# Patient Record
Sex: Female | Born: 1980
Health system: Southern US, Community
[De-identification: ages and names within clinical notes are randomized; demographics above are authoritative.]

## PROBLEM LIST (undated history)

## (undated) DIAGNOSIS — E669 Obesity, unspecified: Secondary | ICD-10-CM

## (undated) DIAGNOSIS — L509 Urticaria, unspecified: Secondary | ICD-10-CM

## (undated) DIAGNOSIS — T783XXA Angioneurotic edema, initial encounter: Secondary | ICD-10-CM

## (undated) DIAGNOSIS — J309 Allergic rhinitis, unspecified: Secondary | ICD-10-CM

## (undated) DIAGNOSIS — G43829 Menstrual migraine, not intractable, without status migrainosus: Secondary | ICD-10-CM

## (undated) DIAGNOSIS — L409 Psoriasis, unspecified: Secondary | ICD-10-CM

## (undated) HISTORY — DX: Angioneurotic edema, initial encounter: T78.3XXA

## (undated) HISTORY — DX: Urticaria, unspecified: L50.9

## (undated) HISTORY — DX: Obesity, unspecified: E66.9

## (undated) HISTORY — DX: Psoriasis, unspecified: L40.9

## (undated) HISTORY — PX: CHOLECYSTECTOMY: SHX55

## (undated) HISTORY — DX: Menstrual migraine, not intractable, without status migrainosus: G43.829

## (undated) HISTORY — DX: Allergic rhinitis, unspecified: J30.9

---

## 2016-09-15 ENCOUNTER — Encounter: Payer: Self-pay | Admitting: Obstetrics and Gynecology

## 2016-09-15 ENCOUNTER — Ambulatory Visit (INDEPENDENT_AMBULATORY_CARE_PROVIDER_SITE_OTHER): Payer: 59 | Admitting: Obstetrics and Gynecology

## 2016-09-15 VITALS — BP 118/76 | Ht 67.0 in | Wt 222.0 lb

## 2016-09-15 DIAGNOSIS — Z3041 Encounter for surveillance of contraceptive pills: Secondary | ICD-10-CM | POA: Diagnosis not present

## 2016-09-15 DIAGNOSIS — Z01419 Encounter for gynecological examination (general) (routine) without abnormal findings: Secondary | ICD-10-CM | POA: Diagnosis not present

## 2016-09-15 MED ORDER — DROSPIRENONE-ETHINYL ESTRADIOL 3-0.03 MG PO TABS: 1.0000 | ORAL_TABLET | Freq: Every day | ORAL | 12 refills | Status: DC

## 2016-09-15 NOTE — Progress Notes (Signed)
HPI:      [name] is a 36 y.o. G0P0000 who LMP was 08/25/16, presents today for her annual examination.  Her menses are regular every 28-30 days, lasting 5 days, no BTB. Dysmenorrhea none.  She is single partner, contraception - OCP (estrogen/progesterone)  Last Pap: 08/21/14. Results were: neg/neg HPV Hx of STDs: none  There is no FH of breast or ovarian cancer. The patient does due self-breast exams.  no tobacco use very active   ROS: Constitutional: Denied constitutional symptoms, night sweats, recent illness, fatigue, fever, insomnia and weight loss.  Eyes: Denied eye symptoms, eye pain, photophobia, vision change and visual disturbance.  Ears/Nose/Throat/Neck: Denied ear, nose, throat or neck symptoms, hearing loss, nasal discharge, sinus congestion and sore throat.  Cardiovascular: Denied cardiovascular symptoms, arrhythmia, chest pain/pressure, edema, exercise intolerance, orthopnea and palpitations.  Respiratory: Denied pulmonary symptoms, asthma, pleuritic pain, productive sputum, cough, dyspnea and wheezing.  Gastrointestinal: Denied, gastro-esophageal reflux, melena, nausea and vomiting.  Genitourinary: Denied genitourinary symptoms including symptomatic vaginal discharge, pelvic relaxation issues, and urinary complaints.  Musculoskeletal: Denied musculoskeletal symptoms, stiffness, swelling, muscle weakness and myalgia.  Dermatologic: Denied dermatology symptoms, rash and scar.  Neurologic: Denied neurology symptoms, dizziness, headache, neck pain and syncope.  Psychiatric: Denied psychiatric symptoms, anxiety and depression.  Endocrine: Denied endocrine symptoms including hot flashes and night sweats.   Objective: Vitals:   09/15/16 0900  BP: 118/76    Physical Exam  Constitutional: She is oriented to person, place, and time. She appears well-developed and well-nourished.  Genitourinary: Vagina normal and uterus normal.  Neck: Normal range of motion. No  thyromegaly present.  Cardiovascular: Normal rate.   Pulmonary/Chest: Effort normal.  Abdominal: Soft.  Neurological: She is alert and oriented to person, place, and time.  Psychiatric: She has a normal mood and affect. Her behavior is normal.  Vitals reviewed.    Assessment: 1. Encounter for annual routine gynecological examination   2. Encounter for surveillance of contraceptive pills      Plan 1. Breast screening- Exam  2. OCP Rx 3. Counseling for contraception: oral contraceptives (estrogen/progesterone)  GYN counsel breast self exam, diet and exercise    Orders Meds ordered this encounter  Medications  . drospirenone-ethinyl estradiol (YASMIN,ZARAH,SYEDA) 3-0.03 MG tablet    Sig: Take 1 tablet by mouth daily.    Dispense:  1 Package    Refill:  12           F/U  Return in 1 year (on 09/15/2017).  Armentha Branagan B. Macsen Nuttall, PA-C 09/15/2016 9:46 AM

## 2017-09-16 ENCOUNTER — Ambulatory Visit: Payer: 59 | Admitting: Obstetrics and Gynecology

## 2017-09-21 ENCOUNTER — Encounter: Payer: Self-pay | Admitting: Obstetrics and Gynecology

## 2017-09-21 ENCOUNTER — Ambulatory Visit (INDEPENDENT_AMBULATORY_CARE_PROVIDER_SITE_OTHER): Payer: 59 | Admitting: Obstetrics and Gynecology

## 2017-09-21 VITALS — BP 130/82 | HR 85 | Ht 67.0 in | Wt 227.0 lb

## 2017-09-21 DIAGNOSIS — Z1151 Encounter for screening for human papillomavirus (HPV): Secondary | ICD-10-CM

## 2017-09-21 DIAGNOSIS — Z124 Encounter for screening for malignant neoplasm of cervix: Secondary | ICD-10-CM

## 2017-09-21 DIAGNOSIS — Z3041 Encounter for surveillance of contraceptive pills: Secondary | ICD-10-CM | POA: Diagnosis not present

## 2017-09-21 DIAGNOSIS — Z01419 Encounter for gynecological examination (general) (routine) without abnormal findings: Secondary | ICD-10-CM | POA: Diagnosis not present

## 2017-09-21 MED ORDER — DROSPIRENONE-ETHINYL ESTRADIOL 3-0.03 MG PO TABS: 1.0000 | ORAL_TABLET | Freq: Every day | ORAL | 3 refills | Status: DC

## 2017-09-21 NOTE — Patient Instructions (Signed)
I value your feedback and entrusting us with your care. If you get a [facility] patient survey, I would appreciate you taking the time to let us know about your experience today. Thank you! 

## 2017-09-21 NOTE — Progress Notes (Signed)
PCP:  Patient, No Pcp Per   Chief Complaint  Patient presents with  . Gynecologic Exam     HPI:      [name] is a 37 y.o. G0P0000 who LMP was Patient's last menstrual period was 08/23/2017 (exact date)., presents today for her annual examination.  Her menses are regular every 28-30 days, lasting 5 days.  Dysmenorrhea none. She does not have intermenstrual bleeding.  Sex activity: single partner, contraception - OCP (estrogen/progesterone).  Last Pap: August 21, 2014  Results were: no abnormalities /neg HPV DNA  Hx of STDs: none  There is no FH of breast cancer. There is no FH of ovarian cancer. The patient does not do self-breast exams.  Tobacco use: The patient denies current or previous tobacco use. Alcohol use: none No drug use.  Exercise: very active  She does get adequate calcium but not Vitamin D in her diet.   Past Medical History:  Diagnosis Date  . Obesity     Past Surgical History:  Procedure Laterality Date  . CHOLECYSTECTOMY      Family History  Problem Relation Age of Onset  . Diabetes type II Father   . Basal cell carcinoma Maternal Grandmother   . Colon cancer Maternal Grandmother 80  . Congestive Heart Failure Maternal Grandmother   . Lung cancer Maternal Grandmother 80  . Alzheimer's disease Maternal Grandfather   . Brain cancer Maternal Grandfather [id number]       melanoma    Social History   Socioeconomic History  . Marital status: Single    Spouse name: Not on file  . Number of children: Not on file  . Years of education: Not on file  . Highest education level: Not on file  Social Needs  . Financial resource strain: Not on file  . Food insecurity - worry: Not on file  . Food insecurity - inability: Not on file  . Transportation needs - medical: Not on file  . Transportation needs - non-medical: Not on file  Occupational History  . Not on file  Tobacco Use  . Smoking status: Former Games developermoker  . Smokeless tobacco: Never Used    Substance and Sexual Activity  . Alcohol use: No  . Drug use: No  . Sexual activity: Yes    Birth control/protection: Pill  Other Topics Concern  . Not on file  Social History Narrative  . Not on file    Current Meds  Medication Sig  . drospirenone-ethinyl estradiol (YASMIN,ZARAH,SYEDA) 3-0.03 MG tablet Take 1 tablet by mouth daily.  . [DISCONTINUED] drospirenone-ethinyl estradiol (YASMIN,ZARAH,SYEDA) 3-0.03 MG tablet Take 1 tablet by mouth daily.     ROS:  Review of Systems  Constitutional: Negative for fatigue, fever and unexpected weight change.  Respiratory: Negative for cough, shortness of breath and wheezing.   Cardiovascular: Negative for chest pain, palpitations and leg swelling.  Gastrointestinal: Negative for blood in stool, constipation, diarrhea, nausea and vomiting.  Endocrine: Negative for cold intolerance, heat intolerance and polyuria.  Genitourinary: Negative for dyspareunia, dysuria, flank pain, frequency, genital sores, hematuria, menstrual problem, pelvic pain, urgency, vaginal bleeding, vaginal discharge and vaginal pain.  Musculoskeletal: Negative for back pain, joint swelling and myalgias.  Skin: Negative for rash.  Neurological: Negative for dizziness, syncope, light-headedness, numbness and headaches.  Hematological: Negative for adenopathy.  Psychiatric/Behavioral: Negative for agitation, confusion, sleep disturbance and suicidal ideas. The patient is not nervous/anxious.      Objective: BP 130/82   Pulse 85  Ht 5\' 7"  (1.702 m)   Wt 227 lb (103 kg)   LMP 08/23/2017 (Exact Date)   BMI 35.55 kg/m    Physical Exam  Constitutional: She is oriented to person, place, and time. She appears well-developed and well-nourished.  Genitourinary: Vagina normal and uterus normal. There is no rash or tenderness on the right labia. There is no rash or tenderness on the left labia. No erythema or tenderness in the vagina. No vaginal discharge found. Right  adnexum does not display mass and does not display tenderness. Left adnexum does not display mass and does not display tenderness. Cervix does not exhibit motion tenderness or polyp. Uterus is not enlarged or tender.  Neck: Normal range of motion. No thyromegaly present.  Cardiovascular: Normal rate, regular rhythm and normal heart sounds.  No murmur heard. Pulmonary/Chest: Effort normal and breath sounds normal. Right breast exhibits no mass, no nipple discharge, no skin change and no tenderness. Left breast exhibits no mass, no nipple discharge, no skin change and no tenderness.  Abdominal: Soft. There is no tenderness. There is no guarding.  Musculoskeletal: Normal range of motion.  Neurological: She is alert and oriented to person, place, and time. No cranial nerve deficit.  Psychiatric: She has a normal mood and affect. Her behavior is normal.  Vitals reviewed.   Assessment/Plan: Encounter for annual routine gynecological examination  Cervical cancer screening - Plan: IGP, Aptima HPV  Screening for HPV (human papillomavirus) - Plan: IGP, Aptima HPV  Encounter for surveillance of contraceptive pills - OCP RF - Plan: drospirenone-ethinyl estradiol (YASMIN,ZARAH,SYEDA) 3-0.03 MG tablet  Meds ordered this encounter  Medications  . drospirenone-ethinyl estradiol (YASMIN,ZARAH,SYEDA) 3-0.03 MG tablet    Sig: Take 1 tablet by mouth daily.    Dispense:  3 Package    Refill:  3    Order Specific Question:   Supervising Provider    Answer:   Nadara Mustard [[id number]]            GYN counsel adequate intake of calcium and vitamin D     F/U  Return in about 1 year (around 09/22/2018).  Jenna Rouser B. Michell Giuliano, PA-C 09/21/2017 8:37 AM

## 2017-09-24 LAB — IGP, APTIMA HPV
HPV Aptima: NEGATIVE
PAP SMEAR COMMENT: 0

## 2018-05-10 ENCOUNTER — Ambulatory Visit (INDEPENDENT_AMBULATORY_CARE_PROVIDER_SITE_OTHER): Payer: 59 | Admitting: Obstetrics and Gynecology

## 2018-05-10 ENCOUNTER — Encounter: Payer: Self-pay | Admitting: Obstetrics and Gynecology

## 2018-05-10 VITALS — BP 138/70 | HR 85 | Ht 67.0 in | Wt 217.0 lb

## 2018-05-10 DIAGNOSIS — G43839 Menstrual migraine, intractable, without status migrainosus: Secondary | ICD-10-CM | POA: Diagnosis not present

## 2018-05-10 DIAGNOSIS — Z3041 Encounter for surveillance of contraceptive pills: Secondary | ICD-10-CM | POA: Diagnosis not present

## 2018-05-10 MED ORDER — DROSPIRENONE-ETHINYL ESTRADIOL 3-0.03 MG PO TABS: 1.0000 | ORAL_TABLET | Freq: Every day | ORAL | 1 refills | Status: DC

## 2018-05-10 NOTE — Progress Notes (Signed)
Patient, No Pcp Per   Chief Complaint  Patient presents with  . Headache    last entire cycle (3-5 days) x 2 yrs    HPI:      Jenna Wright is a 37 y.o. G0P0000 who LMP was Patient's last menstrual period was 05/04/2018 (exact date)., presents today for menstrual headaches. Pt has monthly headaches and started keeping HA journal recently. She realized sx are with period onset and last until 1-2 days into active pills in new pill pack. Pain is dull and constant, relieved with tylenol arthritis XR. Pt denies n/v/photo or phonophobia. No vision changes. No headaches rest of the month. Pt has noted duration of headaches is longer more recently, and upon retrospection, notes she has had sx for about 2 yrs.  Pt is on yasmin. Menses and headaches start 4 days into placebo pills. Flow is for 2-3 days and light. Pt was on several OCPs with side effects until she found yasmin that works well for her.  Annual due 3/20.   Past Medical History:  Diagnosis Date  . Obesity     Past Surgical History:  Procedure Laterality Date  . CHOLECYSTECTOMY      Family History  Problem Relation Age of Onset  . Diabetes type II Father   . Basal cell carcinoma Maternal Grandmother   . Colon cancer Maternal Grandmother 80  . Congestive Heart Failure Maternal Grandmother   . Lung cancer Maternal Grandmother 80  . Alzheimer's disease Maternal Grandfather   . Brain cancer Maternal Grandfather 62       melanoma    Social History   Socioeconomic History  . Marital status: Single    Spouse name: Not on file  . Number of children: Not on file  . Years of education: Not on file  . Highest education level: Not on file  Occupational History  . Not on file  Social Needs  . Financial resource strain: Not on file  . Food insecurity:    Worry: Not on file    Inability: Not on file  . Transportation needs:    Medical: Not on file    Non-medical: Not on file  Tobacco Use  . Smoking status:  Former Games developer  . Smokeless tobacco: Never Used  Substance and Sexual Activity  . Alcohol use: No  . Drug use: No  . Sexual activity: Yes    Birth control/protection: Pill  Lifestyle  . Physical activity:    Days per week: Not on file    Minutes per session: Not on file  . Stress: Not on file  Relationships  . Social connections:    Talks on phone: Not on file    Gets together: Not on file    Attends religious service: Not on file    Active member of club or organization: Not on file    Attends meetings of clubs or organizations: Not on file    Relationship status: Not on file  . Intimate partner violence:    Fear of current or ex partner: Not on file    Emotionally abused: Not on file    Physically abused: Not on file    Forced sexual activity: Not on file  Other Topics Concern  . Not on file  Social History Narrative  . Not on file    Outpatient Medications Prior to Visit  Medication Sig Dispense Refill  . drospirenone-ethinyl estradiol (YASMIN,ZARAH,SYEDA) 3-0.03 MG tablet Take 1 tablet by mouth daily. 3  Package 3   No facility-administered medications prior to visit.       ROS:  Review of Systems  Constitutional: Negative for fever.  Gastrointestinal: Negative for blood in stool, constipation, diarrhea, nausea and vomiting.  Genitourinary: Negative for dyspareunia, dysuria, flank pain, frequency, hematuria, urgency, vaginal bleeding, vaginal discharge and vaginal pain.  Musculoskeletal: Negative for back pain.  Skin: Negative for rash.  Neurological: Positive for headaches.   BREAST: No symptoms   OBJECTIVE:   Vitals:  BP 138/70   Pulse 85   Ht 5\' 7"  (1.702 m)   Wt 217 lb (98.4 kg)   LMP 05/04/2018 (Exact Date)   BMI 33.99 kg/m   Physical Exam  Constitutional: She is oriented to person, place, and time. She appears well-developed.  Neck: Normal range of motion.  Pulmonary/Chest: Effort normal.  Musculoskeletal: Normal range of motion.    Neurological: She is alert and oriented to person, place, and time. No cranial nerve deficit.  Psychiatric: She has a normal mood and affect. Her behavior is normal. Judgment and thought content normal.  Vitals reviewed.   Assessment/Plan: Intractable menstrual migraine without status migrainosus - Discussed OCP change to Yaz (lower estrogen dose) vs cont dosing. Pt wants to try yasmin cont dosing first. Rx eRxd with instructions. F/u prn.   Encounter for surveillance of contraceptive pills - OCP RF - Plan: drospirenone-ethinyl estradiol (YASMIN,ZARAH,SYEDA) 3-0.03 MG tablet    Meds ordered this encounter  Medications  . drospirenone-ethinyl estradiol (YASMIN,ZARAH,SYEDA) 3-0.03 MG tablet    Sig: Take 1 tablet by mouth daily. CONTINUOUS DOSING FOR HEADACHES    Dispense:  3 Package    Refill:  1    Order Specific Question:   Supervising Provider    Answer:   Nadara Mustard [[id number]]      Return if symptoms worsen or fail to improve.  Alicia B. Copland, PA-C 05/10/2018 3:40 PM

## 2018-05-10 NOTE — Patient Instructions (Signed)
I value your feedback and entrusting us with your care. If you get a [facility] patient survey, I would appreciate you taking the time to let us know about your experience today. Thank you! 

## 2018-07-05 ENCOUNTER — Other Ambulatory Visit: Payer: Self-pay

## 2018-07-05 DIAGNOSIS — Z3041 Encounter for surveillance of contraceptive pills: Secondary | ICD-10-CM

## 2018-07-05 MED ORDER — DROSPIRENONE-ETHINYL ESTRADIOL 3-0.03 MG PO TABS: 1.0000 | ORAL_TABLET | Freq: Every day | ORAL | 1 refills | Status: DC

## 2018-07-05 NOTE — Telephone Encounter (Signed)
Pt [facility]  [phone]  Left detailed msg pharm has been changed and rx sent.

## 2018-08-02 ENCOUNTER — Ambulatory Visit (INDEPENDENT_AMBULATORY_CARE_PROVIDER_SITE_OTHER): Payer: No Typology Code available for payment source | Admitting: Family Medicine

## 2018-08-02 ENCOUNTER — Encounter: Payer: Self-pay | Admitting: Family Medicine

## 2018-08-02 VITALS — BP 126/80 | HR 80 | Temp 98.2°F | Ht 67.0 in | Wt 221.6 lb

## 2018-08-02 DIAGNOSIS — Z203 Contact with and (suspected) exposure to rabies: Secondary | ICD-10-CM

## 2018-08-02 DIAGNOSIS — Z114 Encounter for screening for human immunodeficiency virus [HIV]: Secondary | ICD-10-CM | POA: Diagnosis not present

## 2018-08-02 DIAGNOSIS — L409 Psoriasis, unspecified: Secondary | ICD-10-CM | POA: Insufficient documentation

## 2018-08-02 DIAGNOSIS — J309 Allergic rhinitis, unspecified: Secondary | ICD-10-CM | POA: Insufficient documentation

## 2018-08-02 DIAGNOSIS — J301 Allergic rhinitis due to pollen: Secondary | ICD-10-CM

## 2018-08-02 DIAGNOSIS — Z0000 Encounter for general adult medical examination without abnormal findings: Secondary | ICD-10-CM

## 2018-08-02 MED ORDER — CLOBETASOL PROPIONATE 0.05 % EX CREA: 1.0000 "application " | TOPICAL_CREAM | Freq: Two times a day (BID) | CUTANEOUS | 3 refills | Status: DC

## 2018-08-02 NOTE — Patient Instructions (Signed)
Preventive Care 18-39 Years, Female Preventive care refers to lifestyle choices and visits with your health care provider that can promote health and wellness. What does preventive care include?   A yearly physical exam. This is also called an annual well check.  Dental exams once or twice a year.  Routine eye exams. Ask your health care provider how often you should have your eyes checked.  Personal lifestyle choices, including: ? Daily care of your teeth and gums. ? Regular physical activity. ? Eating a healthy diet. ? Avoiding tobacco and drug use. ? Limiting alcohol use. ? Practicing safe sex. ? Taking vitamin and mineral supplements as recommended by your health care provider. What happens during an annual well check? The services and screenings done by your health care provider during your annual well check will depend on your age, overall health, lifestyle risk factors, and family history of disease. Counseling Your health care provider may ask you questions about your:  Alcohol use.  Tobacco use.  Drug use.  Emotional well-being.  Home and relationship well-being.  Sexual activity.  Eating habits.  Work and work environment.  Method of birth control.  Menstrual cycle.  Pregnancy history. Screening You may have the following tests or measurements:  Height, weight, and BMI.  Diabetes screening. This is done by checking your blood sugar (glucose) after you have not eaten for a while (fasting).  Blood pressure.  Lipid and cholesterol levels. These may be checked every 5 years starting at age 20.  Skin check.  Hepatitis C blood test.  Hepatitis B blood test.  Sexually transmitted disease (STD) testing.  BRCA-related cancer screening. This may be done if you have a family history of breast, ovarian, tubal, or peritoneal cancers.  Pelvic exam and Pap test. This may be done every 3 years starting at age 21. Starting at age 30, this may be done every 5  years if you have a Pap test in combination with an HPV test. Discuss your test results, treatment options, and if necessary, the need for more tests with your health care provider. Vaccines Your health care provider may recommend certain vaccines, such as:  Influenza vaccine. This is recommended every year.  Tetanus, diphtheria, and acellular pertussis (Tdap, Td) vaccine. You may need a Td booster every 10 years.  Varicella vaccine. You may need this if you have not been vaccinated.  HPV vaccine. If you are 26 or younger, you may need three doses over 6 months.  Measles, mumps, and rubella (MMR) vaccine. You may need at least one dose of MMR. You may also need a second dose.  Pneumococcal 13-valent conjugate (PCV13) vaccine. You may need this if you have certain conditions and were not previously vaccinated.  Pneumococcal polysaccharide (PPSV23) vaccine. You may need one or two doses if you smoke cigarettes or if you have certain conditions.  Meningococcal vaccine. One dose is recommended if you are age 19-21 years and a first-year college student living in a residence hall, or if you have one of several medical conditions. You may also need additional booster doses.  Hepatitis A vaccine. You may need this if you have certain conditions or if you travel or work in places where you may be exposed to hepatitis A.  Hepatitis B vaccine. You may need this if you have certain conditions or if you travel or work in places where you may be exposed to hepatitis B.  Haemophilus influenzae type b (Hib) vaccine. You may need this if you   have certain risk factors. Talk to your health care provider about which screenings and vaccines you need and how often you need them. This information is not intended to replace advice given to you by your health care provider. Make sure you discuss any questions you have with your health care provider. Document Released: 09/01/2001 Document Revised: 02/16/2017  Document Reviewed: 05/07/2015 Elsevier Interactive Patient Education  2019 [employer].

## 2018-08-02 NOTE — Progress Notes (Signed)
Patient: Jenna Wright, Female    DOB: May 15, 1981, 38 y.o.   MRN: [id number] Visit Date: 08/04/2018  Today's Provider: Shirlee Latch, MD   Chief Complaint  Patient presents with  . Establish Care   Subjective:  I, Presley Raddle, CMA, am acting as a scribe for Shirlee Latch, MD.    New Patient:  Jenna Wright is a 38 y.o. female who presents today to Establish Care. Patient was previously being seen at [facility]. She feels well. She reports exercising daily. She reports she is sleeping well.  Works at [employer] in [employer].  She also works as a Museum/gallery conservator.  She was vaccinated against rabies in the past and needs titers checked regularly to prove ongoing immunity.  Allergic rhinitis: Symptoms are seasonal.  Uses claritin prn.  Asymptomatic currently.  Psoriasis: Small patches, usually on her back and legs.  Multiple family members with psoriasis and Maternal aunt has psoriatic arthritis.  Using clobetasol cream prn and needs a refill.    Last pap smear 09/2017 - NIL - HPV negative.  Sees [facility] Had flu shot at work -----------------------------------------------------------------   Review of Systems  Constitutional: Negative.   HENT: Negative.   Eyes: Negative.   Respiratory: Negative.   Cardiovascular: Negative.   Gastrointestinal: Negative.   Endocrine: Negative.   Genitourinary: Negative.   Musculoskeletal: Positive for arthralgias.  Skin: Negative.   Allergic/Immunologic: Positive for environmental allergies.  Neurological: Negative.   Hematological: Negative.   Psychiatric/Behavioral: Negative.     Social History      She  reports that she quit smoking about 20 years ago. Her smoking use included cigarettes. She has a 1.00 pack-year smoking history. She has never used smokeless tobacco. She reports that she does not drink alcohol or use drugs.       Social History   Socioeconomic History  . Marital status: Single      Spouse name: Not on file  . Number of children: 0  . Years of education: Not on file  . Highest education level: Not on file  Occupational History  . Occupation: dietician  Social Needs  . Financial resource strain: Not on file  . Food insecurity:    Worry: Not on file    Inability: Not on file  . Transportation needs:    Medical: Not on file    Non-medical: Not on file  Tobacco Use  . Smoking status: Former Smoker    Packs/day: 0.50    Years: 2.00    Pack years: 1.00    Types: Cigarettes    Last attempt to quit: 2000    Years since quitting: 20.0  . Smokeless tobacco: Never Used  Substance and Sexual Activity  . Alcohol use: No  . Drug use: No  . Sexual activity: Yes    Partners: Male    Birth control/protection: Pill  Lifestyle  . Physical activity:    Days per week: Not on file    Minutes per session: Not on file  . Stress: Not on file  Relationships  . Social connections:    Talks on phone: Not on file    Gets together: Not on file    Attends religious service: Not on file    Active member of club or organization: Not on file    Attends meetings of clubs or organizations: Not on file    Relationship status: Not on file  Other Topics Concern  . Not  on file  Social History Narrative  . Not on file    Past Medical History:  Diagnosis Date  . Allergic rhinitis   . Obesity   . Psoriasis      Patient Active Problem List   Diagnosis Date Noted  . Psoriasis   . Allergic rhinitis     Past Surgical History:  Procedure Laterality Date  . CHOLECYSTECTOMY      Family History        Family Status  Relation Name Status  . Father  Alive  . MGM  Alive  . MGF  Deceased  . Mother  Alive  . PGM  Deceased  . PGF  Deceased  . Mat Aunt  (Not Specified)  . Mat Uncle  (Not Specified)        Her family history includes Alzheimer's disease in her maternal grandfather; Atrial fibrillation in her maternal grandmother; Autoimmune disease in her mother; Basal  cell carcinoma in her maternal grandmother; Colon cancer (age of onset: 57) in her maternal grandmother; Congestive Heart Failure in her maternal grandmother; Diabetes in her paternal grandmother; Diabetes type II in her father; Lung cancer (age of onset: 66) in her maternal grandmother; Melanoma in her maternal uncle; Psoriasis in her maternal aunt.      Allergies  Allergen Reactions  . Other Shortness Of Breath and Swelling    Bee stings  . Penicillins Hives and Rash     Current Outpatient Medications:  .  drospirenone-ethinyl estradiol (YASMIN,ZARAH,SYEDA) 3-0.03 MG tablet, Take 1 tablet by mouth daily. CONTINUOUS DOSING FOR HEADACHES, Disp: 3 Package, Rfl: 1 .  clobetasol cream (TEMOVATE) 0.05 %, Apply 1 application topically 2 (two) times daily., Disp: 60 g, Rfl: 3   Patient Care Team: Erasmo Downer, MD as PCP - General (Family Medicine)      Objective:   Vitals: BP 126/80 (BP Location: Right Arm, Patient Position: Sitting, Cuff Size: Normal)   Pulse 80   Temp 98.2 F (36.8 C) (Oral)   Ht 5\' 7"  (1.702 m)   Wt 221 lb 9.6 oz (100.5 kg)   SpO2 99%   BMI 34.71 kg/m    Vitals:   08/02/18 1030  BP: 126/80  Pulse: 80  Temp: 98.2 F (36.8 C)  TempSrc: Oral  SpO2: 99%  Weight: 221 lb 9.6 oz (100.5 kg)  Height: 5\' 7"  (1.702 m)     Physical Exam Vitals signs reviewed.  Constitutional:      General: She is not in acute distress.    Appearance: Normal appearance. She is well-developed. She is not diaphoretic.  HENT:     Head: Normocephalic and atraumatic.     Right Ear: Tympanic membrane, ear canal and external ear normal.     Left Ear: Tympanic membrane, ear canal and external ear normal.     Nose: Nose normal.     Mouth/Throat:     Mouth: Mucous membranes are moist.     Pharynx: Oropharynx is clear. No oropharyngeal exudate.  Eyes:     General: No scleral icterus.    Conjunctiva/sclera: Conjunctivae normal.     Pupils: Pupils are equal, round, and reactive  to light.  Neck:     Musculoskeletal: Neck supple.     Thyroid: No thyromegaly.  Cardiovascular:     Rate and Rhythm: Normal rate and regular rhythm.     Pulses: Normal pulses.     Heart sounds: Normal heart sounds. No murmur.  Pulmonary:     Effort: Pulmonary  effort is normal. No respiratory distress.     Breath sounds: Normal breath sounds. No wheezing or rales.  Abdominal:     General: Bowel sounds are normal. There is no distension.     Palpations: Abdomen is soft.     Tenderness: There is no abdominal tenderness. There is no guarding or rebound.  Musculoskeletal:        General: No deformity.     Right lower leg: No edema.     Left lower leg: No edema.  Lymphadenopathy:     Cervical: No cervical adenopathy.  Skin:    General: Skin is warm and dry.     Capillary Refill: Capillary refill takes less than 2 seconds.     Findings: Rash (small patches of psoriasis on back) present.  Neurological:     Mental Status: She is alert and oriented to person, place, and time. Mental status is at baseline.     Cranial Nerves: No cranial nerve deficit.  Psychiatric:        Mood and Affect: Mood normal.        Behavior: Behavior normal.        Thought Content: Thought content normal.      Depression Screen PHQ 2/9 Scores 08/02/2018  PHQ - 2 Score 0  PHQ- 9 Score 0    Assessment & Plan:     Routine Health Maintenance and Physical Exam  Exercise Activities and Dietary recommendations Goals   None     Immunization History  Administered Date(s) Administered  . Influenza-Unspecified 03/20/2018  . Tdap 08/15/2012    Health Maintenance  Topic Date Due  . PAP SMEAR-Modifier  09/21/2020  . TETANUS/TDAP  08/15/2022  . INFLUENZA VACCINE  Completed  . HIV Screening  Completed     Discussed health benefits of physical activity, and encouraged her to engage in regular exercise appropriate for her age and condition.      --------------------------------------------------------------------  Problem List Items Addressed This Visit      Respiratory   Allergic rhinitis    Well controlled Continue seasonal OTC antihistamine prn        Musculoskeletal and Integument   Psoriasis    Fairly well controlled No joint symptoms or abnormalities Continue clobetasol prn       Other Visit Diagnoses    Encounter for annual physical exam    -  Primary   Relevant Orders   Lipid panel (Completed)   Comprehensive metabolic panel (Completed)   CBC w/Diff/Platelet (Completed)   HIV antibody (with reflex) (Completed)   Screening for HIV (human immunodeficiency virus)       Relevant Orders   HIV antibody (with reflex) (Completed)   Contact with rabies       Relevant Orders   Rabies Neut.Abs Titrat.(RFFIT) (Completed)       Return in about 1 year (around 08/03/2019) for CPE.   The entirety of the information documented in the History of Present Illness, Review of Systems and Physical Exam were personally obtained by me. Portions of this information were initially documented by Presley RaddleNikki Walston, CMA and reviewed by me for thoroughness and accuracy.    Erasmo DownerBacigalupo, Angela M, MD, MPH [facility] 08/04/2018 1:01 PM

## 2018-08-04 ENCOUNTER — Encounter: Payer: Self-pay | Admitting: Family Medicine

## 2018-08-04 ENCOUNTER — Telehealth: Payer: Self-pay

## 2018-08-04 NOTE — Telephone Encounter (Signed)
Patient was advised.  

## 2018-08-04 NOTE — Assessment & Plan Note (Signed)
Fairly well controlled No joint symptoms or abnormalities Continue clobetasol prn

## 2018-08-04 NOTE — Telephone Encounter (Signed)
-----   Message from Erasmo Downer, MD sent at 08/03/2018  5:27 PM EST ----- Normal labs, except cholesterol is slightly high.  Recommend diet low in saturated fats and regular exercise to lower this.  No need for meds at this time.  Rabies titer pending

## 2018-08-04 NOTE — Assessment & Plan Note (Signed)
Well controlled Continue seasonal OTC antihistamine prn

## 2018-08-29 ENCOUNTER — Encounter: Payer: Self-pay | Admitting: Family Medicine

## 2018-08-30 LAB — CBC WITH DIFFERENTIAL/PLATELET
BASOS: 1 %
Basophils Absolute: 0.1 10*3/uL (ref 0.0–0.2)
EOS (ABSOLUTE): 0.3 10*3/uL (ref 0.0–0.4)
Eos: 4 %
Hematocrit: 38.9 % (ref 34.0–46.6)
Hemoglobin: 12.9 g/dL (ref 11.1–15.9)
IMMATURE GRANS (ABS): 0 10*3/uL (ref 0.0–0.1)
IMMATURE GRANULOCYTES: 0 %
LYMPHS: 37 %
Lymphocytes Absolute: 2.9 10*3/uL (ref 0.7–3.1)
MCH: 28.8 pg (ref 26.6–33.0)
MCHC: 33.2 g/dL (ref 31.5–35.7)
MCV: 87 fL (ref 79–97)
MONOS ABS: 0.5 10*3/uL (ref 0.1–0.9)
Monocytes: 7 %
NEUTROS PCT: 51 %
Neutrophils Absolute: 4 10*3/uL (ref 1.4–7.0)
PLATELETS: 354 10*3/uL (ref 150–450)
RBC: 4.48 x10E6/uL (ref 3.77–5.28)
RDW: 13.2 % (ref 11.7–15.4)
WBC: 7.9 10*3/uL (ref 3.4–10.8)

## 2018-08-30 LAB — COMPREHENSIVE METABOLIC PANEL
ALK PHOS: 72 IU/L (ref 39–117)
ALT: 15 IU/L (ref 0–32)
AST: 13 IU/L (ref 0–40)
Albumin/Globulin Ratio: 1.4 (ref 1.2–2.2)
Albumin: 4 g/dL (ref 3.5–5.5)
BUN/Creatinine Ratio: 16 (ref 9–23)
BUN: 13 mg/dL (ref 6–20)
Bilirubin Total: 0.4 mg/dL (ref 0.0–1.2)
CALCIUM: 8.9 mg/dL (ref 8.7–10.2)
CO2: 22 mmol/L (ref 20–29)
Chloride: 106 mmol/L (ref 96–106)
Creatinine, Ser: 0.83 mg/dL (ref 0.57–1.00)
GFR calc Af Amer: 103 mL/min/{1.73_m2} (ref >59)
GFR, EST NON AFRICAN AMERICAN: 90 mL/min/{1.73_m2} (ref >59)
Globulin, Total: 2.8 g/dL (ref 1.5–4.5)
Glucose: 89 mg/dL (ref 65–99)
Potassium: 4 mmol/L (ref 3.5–5.2)
SODIUM: 143 mmol/L (ref 134–144)
Total Protein: 6.8 g/dL (ref 6.0–8.5)

## 2018-08-30 LAB — RABIES NEUT.ABS TITRAT.(RFFIT)

## 2018-08-30 LAB — LIPID PANEL
CHOL/HDL RATIO: 2.4 ratio (ref 0.0–4.4)
Cholesterol, Total: 226 mg/dL — ABNORMAL HIGH (ref 100–199)
HDL: 95 mg/dL (ref >39)
LDL CALC: 105 mg/dL — AB (ref 0–99)
Triglycerides: 132 mg/dL (ref 0–149)
VLDL CHOLESTEROL CAL: 26 mg/dL (ref 5–40)

## 2018-08-30 LAB — HIV ANTIBODY (ROUTINE TESTING W REFLEX): HIV Screen 4th Generation wRfx: NONREACTIVE

## 2018-09-26 ENCOUNTER — Ambulatory Visit (INDEPENDENT_AMBULATORY_CARE_PROVIDER_SITE_OTHER): Payer: No Typology Code available for payment source | Admitting: Obstetrics and Gynecology

## 2018-09-26 ENCOUNTER — Encounter: Payer: Self-pay | Admitting: Obstetrics and Gynecology

## 2018-09-26 VITALS — BP 130/80 | HR 81 | Ht 67.0 in | Wt 225.0 lb

## 2018-09-26 DIAGNOSIS — Z01419 Encounter for gynecological examination (general) (routine) without abnormal findings: Secondary | ICD-10-CM

## 2018-09-26 DIAGNOSIS — Z3041 Encounter for surveillance of contraceptive pills: Secondary | ICD-10-CM

## 2018-09-26 DIAGNOSIS — G43839 Menstrual migraine, intractable, without status migrainosus: Secondary | ICD-10-CM

## 2018-09-26 MED ORDER — DROSPIRENONE-ETHINYL ESTRADIOL 3-0.02 MG PO TABS: 1.0000 | ORAL_TABLET | Freq: Every day | ORAL | 3 refills | Status: DC

## 2018-09-26 NOTE — Patient Instructions (Signed)
I value your feedback and entrusting us with your care. If you get a Jenna Wright patient survey, I would appreciate you taking the time to let us know about your experience today. Thank you! 

## 2018-09-26 NOTE — Progress Notes (Signed)
PCP:  Erasmo Downer, MD   Chief Complaint  Patient presents with  . Gynecologic Exam     HPI:      [name] is a 38 y.o. G0P0000 who LMP was Patient's last menstrual period was 09/21/2018 (approximate)., presents today for her annual examination.  Her menses are regular every 28-30 days, lasting 5 days.  Dysmenorrhea none. She does not have intermenstrual bleeding. Has menstrual migraines. Discussed doing cont dosing vs Yaz OCPs, but pt has been able to control them recently with tylenol. Is interested in trying yaz, however.   Sex activity: single partner, contraception - OCP (estrogen/progesterone).  Last Pap: 09/21/17  Results were: no abnormalities /neg HPV DNA  Hx of STDs: none  There is no FH of breast cancer. There is no FH of ovarian cancer. The patient does do self-breast exams.  Tobacco use: The patient denies current or previous tobacco use. Alcohol use: none No drug use.  Exercise: very active  She does get adequate calcium but not Vitamin D in her diet. Labs with PCP.   Past Medical History:  Diagnosis Date  . Allergic rhinitis   . Menstrual migraine   . Obesity   . Psoriasis     Past Surgical History:  Procedure Laterality Date  . CHOLECYSTECTOMY      Family History  Problem Relation Age of Onset  . Diabetes type II Father   . Basal cell carcinoma Maternal Grandmother   . Colon cancer Maternal Grandmother 80  . Congestive Heart Failure Maternal Grandmother   . Lung cancer Maternal Grandmother 80  . Atrial fibrillation Maternal Grandmother   . Alzheimer's disease Maternal Grandfather   . Colon cancer Maternal Grandfather 80  . Autoimmune disease Mother   . Diabetes Paternal Grandmother   . Psoriasis Maternal Aunt        with psoriatic arthritis  . Melanoma Maternal Uncle        with brain mets    Social History   Socioeconomic History  . Marital status: Single    Spouse name: Not on file  . Number of children: 0  . Years  of education: Not on file  . Highest education level: Not on file  Occupational History  . Occupation: dietician  Social Needs  . Financial resource strain: Not on file  . Food insecurity:    Worry: Not on file    Inability: Not on file  . Transportation needs:    Medical: Not on file    Non-medical: Not on file  Tobacco Use  . Smoking status: Former Smoker    Packs/day: 0.50    Years: 2.00    Pack years: 1.00    Types: Cigarettes    Last attempt to quit: 2000    Years since quitting: 20.2  . Smokeless tobacco: Never Used  Substance and Sexual Activity  . Alcohol use: No  . Drug use: No  . Sexual activity: Yes    Partners: Male    Birth control/protection: Pill  Lifestyle  . Physical activity:    Days per week: Not on file    Minutes per session: Not on file  . Stress: Not on file  Relationships  . Social connections:    Talks on phone: Not on file    Gets together: Not on file    Attends religious service: Not on file    Active member of club or organization: Not on file    Attends meetings of clubs  or organizations: Not on file    Relationship status: Not on file  . Intimate partner violence:    Fear of current or ex partner: Not on file    Emotionally abused: Not on file    Physically abused: Not on file    Forced sexual activity: Not on file  Other Topics Concern  . Not on file  Social History Narrative  . Not on file    Current Meds  Medication Sig  . clobetasol cream (TEMOVATE) 0.05 % Apply 1 application topically 2 (two) times daily.  . [DISCONTINUED] drospirenone-ethinyl estradiol (YASMIN,ZARAH,SYEDA) 3-0.03 MG tablet Take 1 tablet by mouth daily. CONTINUOUS DOSING FOR HEADACHES     ROS:  Review of Systems  Constitutional: Negative for fatigue, fever and unexpected weight change.  Respiratory: Negative for cough, shortness of breath and wheezing.   Cardiovascular: Negative for chest pain, palpitations and leg swelling.  Gastrointestinal:  Negative for blood in stool, constipation, diarrhea, nausea and vomiting.  Endocrine: Negative for cold intolerance, heat intolerance and polyuria.  Genitourinary: Negative for dyspareunia, dysuria, flank pain, frequency, genital sores, hematuria, menstrual problem, pelvic pain, urgency, vaginal bleeding, vaginal discharge and vaginal pain.  Musculoskeletal: Negative for back pain, joint swelling and myalgias.  Skin: Negative for rash.  Neurological: Negative for dizziness, syncope, light-headedness, numbness and headaches.  Hematological: Negative for adenopathy.  Psychiatric/Behavioral: Negative for agitation, confusion, sleep disturbance and suicidal ideas. The patient is not nervous/anxious.      Objective: BP 130/80   Pulse 81   Ht 5\' 7"  (1.702 m)   Wt 225 lb (102.1 kg)   LMP 09/21/2018 (Approximate)   BMI 35.24 kg/m    Physical Exam Constitutional:      Appearance: She is well-developed.  Genitourinary:     Vulva, vagina, uterus, right adnexa and left adnexa normal.     No vulval lesion or tenderness noted.     No vaginal discharge, erythema or tenderness.     No cervical motion tenderness or polyp.     Uterus is not enlarged or tender.     No right or left adnexal mass present.     Right adnexa not tender.     Left adnexa not tender.  Neck:     Musculoskeletal: Normal range of motion.     Thyroid: No thyromegaly.  Cardiovascular:     Rate and Rhythm: Normal rate and regular rhythm.     Heart sounds: Normal heart sounds. No murmur.  Pulmonary:     Effort: Pulmonary effort is normal.     Breath sounds: Normal breath sounds.  Chest:     Breasts:        Right: No mass, nipple discharge, skin change or tenderness.        Left: No mass, nipple discharge, skin change or tenderness.  Abdominal:     Palpations: Abdomen is soft.     Tenderness: There is no abdominal tenderness. There is no guarding.  Musculoskeletal: Normal range of motion.  Neurological:     General:  No focal deficit present.     Mental Status: She is alert and oriented to person, place, and time.     Cranial Nerves: No cranial nerve deficit.  Skin:    General: Skin is warm and dry.  Psychiatric:        Mood and Affect: Mood normal.        Behavior: Behavior normal.        Thought Content: Thought content normal.  Judgment: Judgment normal.  Vitals signs reviewed.     Assessment/Plan: Encounter for annual routine gynecological examination  Encounter for surveillance of contraceptive pills - OCP change to Yaz for menstrual migraine. F/u pnr.  - Plan: drospirenone-ethinyl estradiol (YAZ) 3-0.02 MG tablet  Intractable menstrual migraine without status migrainosus - Try lower dose OCPs. F/u prn.   Meds ordered this encounter  Medications  . drospirenone-ethinyl estradiol (YAZ) 3-0.02 MG tablet    Sig: Take 1 tablet by mouth daily.    Dispense:  3 Package    Refill:  3    Order Specific Question:   Supervising Provider    Answer:   Nadara MustardHARRIS, ROBERT P [[id number]]            GYN counsel adequate intake of calcium and vitamin D     F/U  Return in about 1 year (around 09/26/2019).  Shaine Mount B. Jhordan Kinter, PA-C 09/26/2018 8:33 AM

## 2019-07-27 ENCOUNTER — Telehealth: Payer: Self-pay

## 2019-07-27 NOTE — Telephone Encounter (Signed)
Looks like it for a CPE.  OK to reschedule if she is worried about exposures.  If she is in PPE at work, then she can come in despite the exposures to [employer]

## 2019-07-27 NOTE — Telephone Encounter (Signed)
Copied from CRM [id number]. Topic: Appointment Scheduling - Scheduling Inquiry for Clinic >> Jul 27, 2019  1:30 PM Jenna Wright wrote: Reason for CRM: pt called and stated that she needs to reschedule appointment for 08/23/18. 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 Please advise

## 2019-08-02 ENCOUNTER — Telehealth: Payer: Self-pay

## 2019-08-02 NOTE — Telephone Encounter (Signed)
Copied from CRM [id number]. Topic: General - Other >> Aug 02, 2019  3:01 PM Gwenlyn Fudge wrote: Reason for CRM: Pt called stating that she is needing to have her appt rescheduled from 08/24/19. Please advise.

## 2019-08-02 NOTE — Telephone Encounter (Signed)
LMTCB to change appt.

## 2019-08-09 NOTE — Telephone Encounter (Signed)
Patient returning call. PEC unable schedule, as it is for CPE. Patient requesting to come in the same day appointment is scheduled for but at a different time.

## 2019-08-09 NOTE — Telephone Encounter (Signed)
Pt rescheduled

## 2019-08-24 ENCOUNTER — Encounter: Payer: No Typology Code available for payment source | Admitting: Family Medicine

## 2019-09-01 ENCOUNTER — Encounter: Payer: Self-pay | Admitting: Family Medicine

## 2019-09-11 ENCOUNTER — Other Ambulatory Visit: Payer: Self-pay | Admitting: Obstetrics and Gynecology

## 2019-09-11 DIAGNOSIS — Z3041 Encounter for surveillance of contraceptive pills: Secondary | ICD-10-CM

## 2019-09-26 NOTE — Progress Notes (Signed)
PCP:  Erasmo Downer, MD   Chief Complaint  Patient presents with  . Gynecologic Exam     HPI:      [name] is a 39 y.o. G0P0000 who LMP was Patient's last menstrual period was 09/20/2019 (approximate)., presents today for her annual examination.  Her menses are absent now with OCPs, or has occas spotting for a few days on placebo pills  Dysmenorrhea none. She does not have intermenstrual bleeding. Has menstrual migraines and changed to yaz last yr for sx control. Now has headaches 3rd wk of pills but less intense, improved with tylenol. Pt wants to cont yaz.  Sex activity: single partner, contraception - OCP (estrogen/progesterone).  Last Pap: 09/21/17  Results were: no abnormalities /neg HPV DNA  Hx of STDs: none  There is no FH of breast cancer. There is no FH of ovarian cancer. The patient does do self-breast exams.  Tobacco use: The patient denies current or previous tobacco use. Alcohol use: none No drug use.  Exercise: very active  She does get adequate calcium and Vitamin D in her diet. Labs with PCP.   Past Medical History:  Diagnosis Date  . Allergic rhinitis   . Menstrual migraine   . Obesity   . Psoriasis     Past Surgical History:  Procedure Laterality Date  . CHOLECYSTECTOMY      Family History  Problem Relation Age of Onset  . Diabetes type II Father   . Basal cell carcinoma Maternal Grandmother   . Colon cancer Maternal Grandmother 80  . Congestive Heart Failure Maternal Grandmother   . Lung cancer Maternal Grandmother 80  . Atrial fibrillation Maternal Grandmother   . Alzheimer's disease Maternal Grandfather   . Colon cancer Maternal Grandfather 80  . Autoimmune disease Mother   . Diabetes Paternal Grandmother   . Psoriasis Maternal Aunt        with psoriatic arthritis  . Melanoma Maternal Uncle        with brain mets    Social History   Socioeconomic History  . Marital status: Single    Spouse name: Not on file  .  Number of children: 0  . Years of education: Not on file  . Highest education level: Not on file  Occupational History  . Occupation: dietician  Tobacco Use  . Smoking status: Former Smoker    Packs/day: 0.50    Years: 2.00    Pack years: 1.00    Types: Cigarettes    Quit date: 2000    Years since quitting: 21.2  . Smokeless tobacco: Never Used  Substance and Sexual Activity  . Alcohol use: No  . Drug use: No  . Sexual activity: Yes    Partners: Male    Birth control/protection: Pill  Other Topics Concern  . Not on file  Social History Narrative  . Not on file   Social Determinants of Health   Financial Resource Strain:   . Difficulty of Paying Living Expenses: Not on file  Food Insecurity:   . Worried About Programme researcher, broadcasting/film/video in the Last Year: Not on file  . Ran Out of Food in the Last Year: Not on file  Transportation Needs:   . Lack of Transportation (Medical): Not on file  . Lack of Transportation (Non-Medical): Not on file  Physical Activity:   . Days of Exercise per Week: Not on file  . Minutes of Exercise per Session: Not on file  Stress:   .  Feeling of Stress : Not on file  Social Connections:   . Frequency of Communication with Friends and Family: Not on file  . Frequency of Social Gatherings with Friends and Family: Not on file  . Attends Religious Services: Not on file  . Active Member of Clubs or Organizations: Not on file  . Attends Banker Meetings: Not on file  . Marital Status: Not on file  Intimate Partner Violence:   . Fear of Current or Ex-Partner: Not on file  . Emotionally Abused: Not on file  . Physically Abused: Not on file  . Sexually Abused: Not on file    Current Meds  Medication Sig  . clobetasol cream (TEMOVATE) 0.05 % Apply 1 application topically 2 (two) times daily.  . drospirenone-ethinyl estradiol (YAZ) 3-0.02 MG tablet Take 1 tablet by mouth daily.  . [DISCONTINUED] drospirenone-ethinyl estradiol (YAZ) 3-0.02 MG  tablet TAKE 1 TABLET BY MOUTH DAILY.     ROS:  Review of Systems  Constitutional: Negative for fatigue, fever and unexpected weight change.  Respiratory: Negative for cough, shortness of breath and wheezing.   Cardiovascular: Negative for chest pain, palpitations and leg swelling.  Gastrointestinal: Negative for blood in stool, constipation, diarrhea, nausea and vomiting.  Endocrine: Negative for cold intolerance, heat intolerance and polyuria.  Genitourinary: Negative for dyspareunia, dysuria, flank pain, frequency, genital sores, hematuria, menstrual problem, pelvic pain, urgency, vaginal bleeding, vaginal discharge and vaginal pain.  Musculoskeletal: Negative for back pain, joint swelling and myalgias.  Skin: Negative for rash.  Neurological: Negative for dizziness, syncope, light-headedness, numbness and headaches.  Hematological: Negative for adenopathy.  Psychiatric/Behavioral: Negative for agitation, confusion, sleep disturbance and suicidal ideas. The patient is not nervous/anxious.      Objective: BP 120/90   Ht 5\' 7"  (1.702 m)   Wt 232 lb (105.2 kg)   LMP 09/20/2019 (Approximate)   BMI 36.34 kg/m    Physical Exam Constitutional:      Appearance: She is well-developed.  Genitourinary:     Vulva, vagina, uterus, right adnexa and left adnexa normal.     No vulval lesion or tenderness noted.     No vaginal discharge, erythema or tenderness.     No cervical motion tenderness or polyp.     Uterus is not enlarged or tender.     No right or left adnexal mass present.     Right adnexa not tender.     Left adnexa not tender.  Neck:     Thyroid: No thyromegaly.  Cardiovascular:     Rate and Rhythm: Normal rate and regular rhythm.     Heart sounds: Normal heart sounds. No murmur.  Pulmonary:     Effort: Pulmonary effort is normal.     Breath sounds: Normal breath sounds.  Chest:     Breasts:        Right: No mass, nipple discharge, skin change or tenderness.         Left: No mass, nipple discharge, skin change or tenderness.  Abdominal:     Palpations: Abdomen is soft.     Tenderness: There is no abdominal tenderness. There is no guarding.  Musculoskeletal:        General: Normal range of motion.     Cervical back: Normal range of motion.  Neurological:     General: No focal deficit present.     Mental Status: She is alert and oriented to person, place, and time.     Cranial Nerves: No cranial nerve  deficit.  Skin:    General: Skin is warm and dry.  Psychiatric:        Mood and Affect: Mood normal.        Behavior: Behavior normal.        Thought Content: Thought content normal.        Judgment: Judgment normal.  Vitals reviewed.     Assessment/Plan: Encounter for annual routine gynecological examination  Encounter for surveillance of contraceptive pills - Plan: drospirenone-ethinyl estradiol (YAZ) 3-0.02 MG tablet; OCP RF  Intractable menstrual migraine without status migrainosus--sx less intense with yaz OCPs. F/u prn.   Meds ordered this encounter  Medications  . drospirenone-ethinyl estradiol (YAZ) 3-0.02 MG tablet    Sig: Take 1 tablet by mouth daily.    Dispense:  84 tablet    Refill:  3    Order Specific Question:   Supervising Provider    Answer:   Gae Dry [[id number]]            GYN counsel adequate intake of calcium and vitamin D     F/U  Return in about 1 year (around 09/26/2020).  Aidyn Sportsman B. Jeison Delpilar, PA-C 09/27/2019 8:22 AM

## 2019-09-26 NOTE — Patient Instructions (Signed)
I value your feedback and entrusting us with your care. If you get a [facility] patient survey, I would appreciate you taking the time to let us know about your experience today. Thank you!  As of June 29, 2019, your lab results will be released to your MyChart immediately, before I even have a chance to see them. Please give me time to review them and contact you if there are any abnormalities. Thank you for your patience.  

## 2019-09-27 ENCOUNTER — Encounter: Payer: Self-pay | Admitting: Obstetrics and Gynecology

## 2019-09-27 ENCOUNTER — Other Ambulatory Visit: Payer: Self-pay | Admitting: Obstetrics and Gynecology

## 2019-09-27 ENCOUNTER — Ambulatory Visit (INDEPENDENT_AMBULATORY_CARE_PROVIDER_SITE_OTHER): Payer: No Typology Code available for payment source | Admitting: Obstetrics and Gynecology

## 2019-09-27 ENCOUNTER — Other Ambulatory Visit: Payer: Self-pay

## 2019-09-27 VITALS — BP 120/90 | Ht 67.0 in | Wt 232.0 lb

## 2019-09-27 DIAGNOSIS — Z01419 Encounter for gynecological examination (general) (routine) without abnormal findings: Secondary | ICD-10-CM

## 2019-09-27 DIAGNOSIS — Z3041 Encounter for surveillance of contraceptive pills: Secondary | ICD-10-CM | POA: Diagnosis not present

## 2019-09-27 DIAGNOSIS — G43839 Menstrual migraine, intractable, without status migrainosus: Secondary | ICD-10-CM | POA: Diagnosis not present

## 2019-09-27 MED ORDER — DROSPIRENONE-ETHINYL ESTRADIOL 3-0.02 MG PO TABS: 1.0000 | ORAL_TABLET | Freq: Every day | ORAL | 3 refills | Status: DC

## 2019-10-02 ENCOUNTER — Ambulatory Visit (INDEPENDENT_AMBULATORY_CARE_PROVIDER_SITE_OTHER): Payer: No Typology Code available for payment source | Admitting: Family Medicine

## 2019-10-02 ENCOUNTER — Other Ambulatory Visit: Payer: Self-pay

## 2019-10-02 ENCOUNTER — Encounter: Payer: Self-pay | Admitting: Family Medicine

## 2019-10-02 VITALS — BP 117/80 | HR 70 | Temp 96.9°F | Resp 16 | Wt 232.0 lb

## 2019-10-02 DIAGNOSIS — Z0000 Encounter for general adult medical examination without abnormal findings: Secondary | ICD-10-CM

## 2019-10-02 DIAGNOSIS — Z6836 Body mass index (BMI) 36.0-36.9, adult: Secondary | ICD-10-CM

## 2019-10-02 DIAGNOSIS — E669 Obesity, unspecified: Secondary | ICD-10-CM | POA: Diagnosis not present

## 2019-10-02 NOTE — Progress Notes (Signed)
Patient: Jenna Wright, Female    DOB: [birth year], 39 y.o.   MRN: [id number] Visit Date: 10/02/2019  Today's Provider: Lavon Paganini, MD   Chief Complaint  Patient presents with  . Annual Exam   Subjective:     Annual physical exam Jenna Wright is a 39 y.o. female who presents today for health maintenance and complete physical. She feels well. She reports exercising daily in her home gym. She reports she is sleeping well. She has no health concerns today  -----------------------------------------------------------------   Review of Systems  All other systems reviewed and are negative.   Social History      She  reports that she quit smoking about 21 years ago. Her smoking use included cigarettes. She has a 1.00 pack-year smoking history. She has never used smokeless tobacco. She reports that she does not drink alcohol or use drugs.       Social History   Socioeconomic History  . Marital status: Single    Spouse name: Not on file  . Number of children: 0  . Years of education: Not on file  . Highest education level: Not on file  Occupational History  . Occupation: dietician  Tobacco Use  . Smoking status: Former Smoker    Packs/day: 0.50    Years: 2.00    Pack years: 1.00    Types: Cigarettes    Quit date: 2000    Years since quitting: 21.2  . Smokeless tobacco: Never Used  Substance and Sexual Activity  . Alcohol use: No  . Drug use: No  . Sexual activity: Yes    Partners: Male    Birth control/protection: Pill  Other Topics Concern  . Not on file  Social History Narrative  . Not on file   Social Determinants of Health   Financial Resource Strain:   . Difficulty of Paying Living Expenses:   Food Insecurity:   . Worried About Charity fundraiser in the Last Year:   . Arboriculturist in the Last Year:   Transportation Needs:   . Film/video editor (Medical):   Marland Kitchen Lack of Transportation (Non-Medical):   Physical Activity:   . Days  of Exercise per Week:   . Minutes of Exercise per Session:   Stress:   . Feeling of Stress :   Social Connections:   . Frequency of Communication with Friends and Family:   . Frequency of Social Gatherings with Friends and Family:   . Attends Religious Services:   . Active Member of Clubs or Organizations:   . Attends Archivist Meetings:   Marland Kitchen Marital Status:     Past Medical History:  Diagnosis Date  . Allergic rhinitis   . Menstrual migraine   . Obesity   . Psoriasis      Patient Active Problem List   Diagnosis Date Noted  . Psoriasis   . Allergic rhinitis     Past Surgical History:  Procedure Laterality Date  . CHOLECYSTECTOMY      Family History        Family Status  Relation Name Status  . Father  Alive  . MGM  Alive  . MGF  Deceased  . Mother  Alive  . PGM  Deceased  . PGF  Deceased  . Mat [employer]  . Mat Uncle  Deceased        Her family history includes Alzheimer's disease in her maternal grandfather; Atrial  fibrillation in her maternal grandmother; Autoimmune disease in her mother; Basal cell carcinoma in her maternal grandmother; Colon cancer (age of onset: 49) in her maternal grandfather and maternal grandmother; Congestive Heart Failure in her maternal grandmother; Diabetes in her paternal grandmother; Diabetes type II in her father; Lung cancer (age of onset: 36) in her maternal grandmother; Melanoma in her maternal uncle; Psoriasis in her maternal aunt.      Allergies  Allergen Reactions  . Other Shortness Of Breath and Swelling    Bee stings  . Penicillins Hives and Rash     Current Outpatient Medications:  .  clobetasol cream (TEMOVATE) 0.05 %, Apply 1 application topically 2 (two) times daily., Disp: 60 g, Rfl: 3 .  drospirenone-ethinyl estradiol (YAZ) 3-0.02 MG tablet, Take 1 tablet by mouth daily., Disp: 84 tablet, Rfl: 3   Patient Care Team: Erasmo Downer, MD as PCP - General (Family Medicine)    Objective:      Vitals: BP 117/80 (BP Location: Left Arm, Patient Position: Sitting, Cuff Size: Large)   Pulse 70   Temp (!) 96.9 F (36.1 C) (Temporal)   Resp 16   Wt 232 lb (105.2 kg)   LMP 09/20/2019 (Approximate)   BMI 36.34 kg/m    Vitals:   10/02/19 1401  BP: 117/80  Pulse: 70  Resp: 16  Temp: (!) 96.9 F (36.1 C)  TempSrc: Temporal  Weight: 232 lb (105.2 kg)     Physical Exam Constitutional:      Appearance: Normal appearance. She is obese.  HENT:     Head: Normocephalic and atraumatic.     Right Ear: Tympanic membrane, ear canal and external ear normal.     Left Ear: Tympanic membrane, ear canal and external ear normal.     Nose: Nose normal.     Mouth/Throat:     Mouth: Mucous membranes are moist.     Pharynx: Oropharynx is clear.  Eyes:     Conjunctiva/sclera: Conjunctivae normal.     Pupils: Pupils are equal, round, and reactive to light.  Cardiovascular:     Rate and Rhythm: Normal rate and regular rhythm.     Pulses: Normal pulses.     Heart sounds: Normal heart sounds.  Pulmonary:     Effort: Pulmonary effort is normal.     Breath sounds: Normal breath sounds.  Abdominal:     General: Abdomen is flat. Bowel sounds are normal.     Palpations: Abdomen is soft.  Musculoskeletal:        General: Normal range of motion.     Cervical back: Neck supple.     Comments: Strength 2+ throughout   Skin:    General: Skin is warm and dry.     Capillary Refill: Capillary refill takes less than 2 seconds.  Neurological:     General: No focal deficit present.     Mental Status: She is alert and oriented to person, place, and time.  Psychiatric:        Mood and Affect: Mood normal.        Behavior: Behavior normal.      Depression Screen PHQ 2/9 Scores 10/02/2019 08/02/2018  PHQ - 2 Score 0 0  PHQ- 9 Score 0 0       Assessment & Plan:     Routine Health Maintenance and Physical Exam   Exercise Activities and Dietary recommendations Goals   None      Immunization History  Administered Date(s) Administered  .  Influenza-Unspecified 03/20/2018, 04/24/2019  . Tdap 08/15/2012    Health Maintenance  Topic Date Due  . Janet Berlin  08/15/2022  . PAP SMEAR-Modifier  09/22/2022  . INFLUENZA VACCINE  Completed  . HIV Screening  Completed     Discussed health benefits of physical activity, and encouraged her to engage in regular exercise appropriate for her age and condition. Pt states she has a goal and plan to cut out junk food, workout at home and lose weight this year.   --------------------------------------------------------------------  Problem List Items Addressed This Visit    None    Visit Diagnoses    Encounter for annual physical exam    -  Primary   Relevant Orders   CBC with Differential/Platelet   Comprehensive metabolic panel   Lipid Panel With LDL/HDL Ratio   Hemoglobin A1c   Class 2 obesity without serious comorbidity with body mass index (BMI) of 36.0 to 36.9 in adult, unspecified obesity type       Relevant Orders   CBC with Differential/Platelet   Comprehensive metabolic panel   Lipid Panel With LDL/HDL Ratio   Hemoglobin A1c     Terrill Mohr, Medical Student [employer]

## 2019-10-02 NOTE — Patient Instructions (Signed)
Preventive Care 40-40 Years Old, Female Preventive care refers to visits with your health care provider and lifestyle choices that can promote health and wellness. This includes:  A yearly physical exam. This may also be called an annual well check.  Regular dental visits and eye exams.  Immunizations.  Screening for certain conditions.  Healthy lifestyle choices, such as eating a healthy diet, getting regular exercise, not using drugs or products that contain nicotine and tobacco, and limiting alcohol use. What can I expect for my preventive care visit? Physical exam Your health care provider will check your:  Height and weight. This may be used to calculate body mass index (BMI), which tells if you are at a healthy weight.  Heart rate and blood pressure.  Skin for abnormal spots. Counseling Your health care provider may ask you questions about your:  Alcohol, tobacco, and drug use.  Emotional well-being.  Home and relationship well-being.  Sexual activity.  Eating habits.  Work and work environment.  Method of birth control.  Menstrual cycle.  Pregnancy history. What immunizations do I need?  Influenza (flu) vaccine  This is recommended every year. Tetanus, diphtheria, and pertussis (Tdap) vaccine  You may need a Td booster every 10 years. Varicella (chickenpox) vaccine  You may need this if you have not been vaccinated. Human papillomavirus (HPV) vaccine  If recommended by your health care provider, you may need three doses over 6 months. Measles, mumps, and rubella (MMR) vaccine  You may need at least one dose of MMR. You may also need a second dose. Meningococcal conjugate (MenACWY) vaccine  One dose is recommended if you are age 19-21 years and a first-year college student living in a residence hall, or if you have one of several medical conditions. You may also need additional booster doses. Pneumococcal conjugate (PCV13) vaccine  You may need  this if you have certain conditions and were not previously vaccinated. Pneumococcal polysaccharide (PPSV23) vaccine  You may need one or two doses if you smoke cigarettes or if you have certain conditions. Hepatitis A vaccine  You may need this if you have certain conditions or if you travel or work in places where you may be exposed to hepatitis A. Hepatitis B vaccine  You may need this if you have certain conditions or if you travel or work in places where you may be exposed to hepatitis B. Haemophilus influenzae type b (Hib) vaccine  You may need this if you have certain conditions. You may receive vaccines as individual doses or as more than one vaccine together in one shot (combination vaccines). Talk with your health care provider about the risks and benefits of combination vaccines. What tests do I need?  Blood tests  Lipid and cholesterol levels. These may be checked every 5 years starting at age 20.  Hepatitis C test.  Hepatitis B test. Screening  Diabetes screening. This is done by checking your blood sugar (glucose) after you have not eaten for a while (fasting).  Sexually transmitted disease (STD) testing.  BRCA-related cancer screening. This may be done if you have a family history of breast, ovarian, tubal, or peritoneal cancers.  Pelvic exam and Pap test. This may be done every 3 years starting at age 21. Starting at age 30, this may be done every 5 years if you have a Pap test in combination with an HPV test. Talk with your health care provider about your test results, treatment options, and if necessary, the need for more tests.   Follow these instructions at home: Eating and drinking   Eat a diet that includes fresh fruits and vegetables, whole grains, lean protein, and low-fat dairy.  Take vitamin and mineral supplements as recommended by your health care provider.  Do not drink alcohol if: ? Your health care provider tells you not to drink. ? You are  pregnant, may be pregnant, or are planning to become pregnant.  If you drink alcohol: ? Limit how much you have to 0-1 drink a day. ? Be aware of how much alcohol is in your drink. In the [place] one drink equals one 12 oz bottle of beer (355 mL), one 5 oz glass of wine (148 mL), or one 1 oz glass of hard liquor (44 mL). Lifestyle  Take daily care of your teeth and gums.  Stay active. Exercise for at least 30 minutes on 5 or more days each week.  Do not use any products that contain nicotine or tobacco, such as cigarettes, e-cigarettes, and chewing tobacco. If you need help quitting, ask your health care provider.  If you are sexually active, practice safe sex. Use a condom or other form of birth control (contraception) in order to prevent pregnancy and STIs (sexually transmitted infections). If you plan to become pregnant, see your health care provider for a preconception visit. What's next?  Visit your health care provider once a year for a well check visit.  Ask your health care provider how often you should have your eyes and teeth checked.  Stay up to date on all vaccines. This information is not intended to replace advice given to you by your health care provider. Make sure you discuss any questions you have with your health care provider. Document Revised: 03/17/2018 Document Reviewed: 03/17/2018 Elsevier Patient Education  2020 [employer].

## 2019-10-03 ENCOUNTER — Telehealth: Payer: Self-pay

## 2019-10-03 LAB — LIPID PANEL WITH LDL/HDL RATIO
Cholesterol, Total: 234 mg/dL — ABNORMAL HIGH (ref 100–199)
HDL: 92 mg/dL (ref >39)
LDL Chol Calc (NIH): 112 mg/dL — ABNORMAL HIGH (ref 0–99)
LDL/HDL Ratio: 1.2 ratio (ref 0.0–3.2)
Triglycerides: 178 mg/dL — ABNORMAL HIGH (ref 0–149)
VLDL Cholesterol Cal: 30 mg/dL (ref 5–40)

## 2019-10-03 LAB — CBC WITH DIFFERENTIAL/PLATELET
Basophils Absolute: 0.1 10*3/uL (ref 0.0–0.2)
Basos: 1 %
EOS (ABSOLUTE): 0.4 10*3/uL (ref 0.0–0.4)
Eos: 4 %
Hematocrit: 39.6 % (ref 34.0–46.6)
Hemoglobin: 13.3 g/dL (ref 11.1–15.9)
Immature Grans (Abs): 0 10*3/uL (ref 0.0–0.1)
Immature Granulocytes: 0 %
Lymphocytes Absolute: 3.5 10*3/uL — ABNORMAL HIGH (ref 0.7–3.1)
Lymphs: 35 %
MCH: 28.9 pg (ref 26.6–33.0)
MCHC: 33.6 g/dL (ref 31.5–35.7)
MCV: 86 fL (ref 79–97)
Monocytes Absolute: 0.5 10*3/uL (ref 0.1–0.9)
Monocytes: 5 %
Neutrophils Absolute: 5.3 10*3/uL (ref 1.4–7.0)
Neutrophils: 55 %
Platelets: 371 10*3/uL (ref 150–450)
RBC: 4.6 x10E6/uL (ref 3.77–5.28)
RDW: 13.1 % (ref 11.7–15.4)
WBC: 9.8 10*3/uL (ref 3.4–10.8)

## 2019-10-03 LAB — COMPREHENSIVE METABOLIC PANEL
ALT: 12 IU/L (ref 0–32)
AST: 13 IU/L (ref 0–40)
Albumin/Globulin Ratio: 1.7 (ref 1.2–2.2)
Albumin: 4.3 g/dL (ref 3.8–4.8)
Alkaline Phosphatase: 84 IU/L (ref 39–117)
BUN/Creatinine Ratio: 19 (ref 9–23)
BUN: 16 mg/dL (ref 6–20)
Bilirubin Total: <0.2 mg/dL (ref 0.0–1.2)
CO2: 23 mmol/L (ref 20–29)
Calcium: 9.9 mg/dL (ref 8.7–10.2)
Chloride: 104 mmol/L (ref 96–106)
Creatinine, Ser: 0.86 mg/dL (ref 0.57–1.00)
GFR calc Af Amer: 98 mL/min/{1.73_m2} (ref >59)
GFR calc non Af Amer: 85 mL/min/{1.73_m2} (ref >59)
Globulin, Total: 2.5 g/dL (ref 1.5–4.5)
Glucose: 84 mg/dL (ref 65–99)
Potassium: 4 mmol/L (ref 3.5–5.2)
Sodium: 142 mmol/L (ref 134–144)
Total Protein: 6.8 g/dL (ref 6.0–8.5)

## 2019-10-03 LAB — HEMOGLOBIN A1C
Est. average glucose Bld gHb Est-mCnc: 108 mg/dL
Hgb A1c MFr Bld: 5.4 % (ref 4.8–5.6)

## 2019-10-03 NOTE — Telephone Encounter (Signed)
Seen by patient Jenna Wright on 10/03/2019 10:48 AM EDT

## 2019-10-03 NOTE — Telephone Encounter (Signed)
-----   Message from Angela M Bacigalupo, MD sent at 10/03/2019  9:43 AM EDT ----- Normal labs 

## 2020-01-01 ENCOUNTER — Encounter: Payer: Self-pay | Admitting: Family Medicine

## 2020-01-01 ENCOUNTER — Ambulatory Visit (INDEPENDENT_AMBULATORY_CARE_PROVIDER_SITE_OTHER): Payer: No Typology Code available for payment source | Admitting: Family Medicine

## 2020-01-01 ENCOUNTER — Other Ambulatory Visit: Payer: Self-pay

## 2020-01-01 VITALS — BP 122/74 | HR 75 | Temp 96.9°F | Resp 16 | Ht 67.0 in | Wt 233.0 lb

## 2020-01-01 DIAGNOSIS — S8392XA Sprain of unspecified site of left knee, initial encounter: Secondary | ICD-10-CM | POA: Diagnosis not present

## 2020-01-01 DIAGNOSIS — S93402A Sprain of unspecified ligament of left ankle, initial encounter: Secondary | ICD-10-CM | POA: Diagnosis not present

## 2020-01-01 NOTE — Progress Notes (Signed)
Established patient visit  I,[name],acting as a scribe for Jenna Durie, MD.,have documented all relevant documentation on the behalf of Jenna Durie, MD,as directed by  Jenna Durie, MD while in the presence of Jenna Durie, MD.   Patient: Jenna Wright   DOB: 1980/12/22   39 y.o. Female  MRN: [id number] Visit Date: 01/01/2020  Today's healthcare provider: [name], MD   Chief Complaint  Patient presents with  . Knee Pain   Subjective    Knee Pain  The incident occurred 2 days ago. The incident occurred at home. The injury mechanism was a fall. The pain is present in the left knee. The quality of the pain is described as shooting. The pain is at a severity of 5/10. The pain is moderate. The pain has been constant since onset. Associated symptoms include an inability to bear weight. Pertinent negatives include no loss of motion, loss of sensation, muscle weakness, numbness or tingling. She reports no foreign bodies present. The symptoms are aggravated by movement and weight bearing. She has tried ice, acetaminophen and NSAIDs for the symptoms. The treatment provided moderate relief.   Patient states she fell 2 days ago in her yard.  Patient was carrying a 30 to 40 pound backpack and spraying for weeds.  She slipped walking down the hill in the mud.  When she fell her left leg buckled under her. Patient states she has had left leg pain since. Patient is constant and moderate. Patient states pain radiates from left buttock down to left foot. Patient states her left le\g hurts when bearing weight and also when she is sitting. Patient has been taking tylenol, ibuprofen, and using ice to treat with moderate relief.  She had trouble bearing weight the next day but is improving since then.  She is able to ambulate with a slight limp today.      Medications: Outpatient Medications Prior to Visit  Medication Sig  . clobetasol cream (TEMOVATE) 3.29 %  Apply 1 application topically 2 (two) times daily.  . drospirenone-ethinyl estradiol (YAZ) 3-0.02 MG tablet Take 1 tablet by mouth daily.   No facility-administered medications prior to visit.    Review of Systems  Constitutional: Negative.   Musculoskeletal: Positive for arthralgias and myalgias.  Allergic/Immunologic: Negative.   Neurological: Negative for tingling and numbness.  Hematological: Negative.   Psychiatric/Behavioral: Negative.        Objective    BP 122/74 (BP Location: Left Arm, Patient Position: Sitting, Cuff Size: Large)   Pulse 75   Temp (!) 96.9 F (36.1 C) (Other (Comment))   Resp 16   Ht 5\' 7"  (1.702 m)   Wt 233 lb (105.7 kg)   SpO2 97%   BMI 36.49 kg/m     Physical Exam Vitals reviewed.  Constitutional:      Comments: She is a very pleasant young lady in no acute distress. She is bearing weight today without difficulty.  Cardiovascular:     Rate and Rhythm: Normal rate and regular rhythm.     Pulses: Normal pulses.     Heart sounds: Normal heart sounds.  Pulmonary:     Effort: Pulmonary effort is normal.  Musculoskeletal:        General: Swelling present. No deformity.     Comments: She has mild effusion of left knee.  Minimal swelling of the anterior ankle area.  Straight leg raise is negative.  Figure-of-four is negative the knee is stable  with negative Lachman's and no focal tenderness.  No anterior or medial laxity.  Ankle and foot exam are normal except for minimal anterior tenderness.  There is also no tenderness over the tibial tuberosity  Neurological:     General: No focal deficit present.     Mental Status: She is alert and oriented to person, place, and time.  Psychiatric:        Mood and Affect: Mood normal.        Behavior: Behavior normal.        Thought Content: Thought content normal.        Judgment: Judgment normal.       No results found for any visits on 01/01/20.  Assessment & Plan     1. Sprain of left ankle,  unspecified ligament, initial encounter Use Ice/Heat, Compression Sleeve. Also continue to alternate between Tylenol and Ibuprofen.  2. Sprain of left knee, unspecified ligament, initial encounter I think this is the main injury she has from this.  She is very understanding of therapy and that it will take some time for this to resolve.  She is all ready improved dramatically.  We discussed imaging and I do not think is necessary today.  We will be happy to see her back if she does not improve.   No follow-ups on file.      I, Megan Mans, MD, have reviewed all documentation for this visit. The documentation on 01/03/20 for the exam, diagnosis, procedures, and orders are all accurate and complete.    Jenna Jorge Wendelyn Breslow, MD  [facility] [phone] (phone) [phone] (fax)  [facility]

## 2020-01-01 NOTE — Patient Instructions (Signed)
Use Ice/Heat, Compression Sleeve. Also continue to alternate between Tylenol and Ibuprofen.

## 2020-03-04 ENCOUNTER — Telehealth (INDEPENDENT_AMBULATORY_CARE_PROVIDER_SITE_OTHER): Payer: No Typology Code available for payment source | Admitting: Family Medicine

## 2020-03-04 ENCOUNTER — Encounter: Payer: Self-pay | Admitting: Family Medicine

## 2020-03-04 DIAGNOSIS — Z7185 Encounter for immunization safety counseling: Secondary | ICD-10-CM

## 2020-03-04 DIAGNOSIS — Z7189 Other specified counseling: Secondary | ICD-10-CM

## 2020-03-04 NOTE — Progress Notes (Signed)
MyChart Video Visit    Virtual Visit via Video Note   This visit type was conducted due to national recommendations for restrictions regarding the COVID-19 Pandemic (e.g. social distancing) in an effort to limit this patient's exposure and mitigate transmission in our community. This patient is at least at moderate risk for complications without adequate follow up. This format is felt to be most appropriate for this patient at this time. Physical exam was limited by quality of the video and audio technology used for the visit.    Patient location: home Provider location: [facility] Persons involved in the visit: patient, provider   Patient: Jenna Wright   DOB: August 31, 1980   39 y.o. Female  MRN: [id number] Visit Date: 03/04/2020  Today's healthcare provider: Shirlee Latch, MD   Chief Complaint  Patient presents with   Covid Vaccine    Subjective    HPI  Pt states she has some questions about the Covid vaccines.  She states she has had bad reactions in the past with vaccines.  She also has previous "autoimmune issues".  Pt wants to make sure the covid vaccine is safe for her to take.   She had cat scratch in 7th grade that led to osteomyelitis and LAD  Had swollen lymph nodes after rabies vaccine in the past  Lymph nodes seem to enlarge with any vaccines or infections  She is worried about long term effects from   Social History   Tobacco Use   Smoking status: Former Smoker    Packs/day: 0.50    Years: 2.00    Pack years: 1.00    Types: Cigarettes    Quit date: 2000    Years since quitting: 21.6   Smokeless tobacco: Never Used  Building services engineer Use: Never used  Substance Use Topics   Alcohol use: No   Drug use: No      Medications: Outpatient Medications Prior to Visit  Medication Sig   clobetasol cream (TEMOVATE) 0.05 % Apply 1 application topically 2 (two) times daily.   drospirenone-ethinyl estradiol (YAZ) 3-0.02 MG  tablet Take 1 tablet by mouth daily.   No facility-administered medications prior to visit.    Review of Systems  Constitutional: Negative.   Respiratory: Negative.   Cardiovascular: Negative.   Gastrointestinal: Negative.   Neurological: Negative for dizziness, light-headedness and headaches.       Objective    There were no vitals taken for this visit.    Physical Exam Constitutional:      General: She is not in acute distress.    Appearance: Normal appearance.  HENT:     Head: Normocephalic and atraumatic.  Eyes:     Conjunctiva/sclera: Conjunctivae normal.  Pulmonary:     Effort: Pulmonary effort is normal. No respiratory distress.  Neurological:     Mental Status: She is alert and oriented to person, place, and time. Mental status is at baseline.  Psychiatric:        Mood and Affect: Mood normal.        Behavior: Behavior normal.        Assessment & Plan     1. Vaccine counseling - counseled on safety and efficacy of COVID19 vaccines - discussed that these were large vaccine trials and all steps were taken to ensure safety - dicussed that we have not seen side effects outside of the 67-month window of vaccines in the past -Encouraged her to proceed with COVID-19 vaccine as her  reaction from COVID-19 infection would likely be much more severe than from COVID-19 vaccination -Discussed population health and the importance of vaccines to achieve her to immunity -Answered any questions and concerns that she had about the vaccine - she does not have any medical contraindications to vaccination -Patient reports she feels more comfortable and confident receiving the vaccine and will proceed with mRNA vaccine soon  Return if symptoms worsen or fail to improve.     I discussed the assessment and treatment plan with the patient. The patient was provided an opportunity to ask questions and all were answered. The patient agreed with the plan and demonstrated an  understanding of the instructions.   The patient was advised to call back or seek an in-person evaluation if the symptoms worsen or if the condition fails to improve as anticipated.  I provided 20 minutes of non-face-to-face time during this encounter.  I, Shirlee Latch, MD, have reviewed all documentation for this visit. The documentation on 03/04/20 for the exam, diagnosis, procedures, and orders are all accurate and complete.   Cheila Wickstrom, Marzella Schlein, MD, MPH [facility]

## 2020-08-27 ENCOUNTER — Ambulatory Visit: Payer: Self-pay | Admitting: *Deleted

## 2020-08-27 NOTE — Telephone Encounter (Signed)
Pt called in c/o dizziness for the last 2 weeks.   No other symptoms.    I scheduled her with Osvaldo Angst, PA for 08/29/2020 at 9:20 per the protocol to be seen within 3 days.  Covid questionnaire completed.  In office visit made.  I sent my notes to [facility] for Adriana's information.   Reason for Disposition . [1] MILD dizziness (e.g., walking normally) AND [2] has NOT been evaluated by physician for this  (Exception: dizziness caused by heat exposure, sudden standing, or poor fluid intake)  Answer Assessment - Initial Assessment Questions 1. DESCRIPTION: "Describe your dizziness."     I'm dizzy for 2 wks.   It's not uncommon with my allergies.  I'll take a Claritine and I'm fine. When I move, driving I feel a little off.   A sudden movement I feel dizzy. I have chronically sinus issues.  This is no different. 2. LIGHTHEADED: "Do you feel lightheaded?" (e.g., somewhat faint, woozy, weak upon standing)     Yes with sudden movement 3. VERTIGO: "Do you feel like either you or the room is spinning or tilting?" (i.e. vertigo)     No 4. SEVERITY: "How bad is it?"  "Do you feel like you are going to faint?" "Can you stand and walk?"   - MILD: Feels slightly dizzy, but walking normally.   - MODERATE: Feels very unsteady when walking, but not falling; interferes with normal activities (e.g., school, work) .   - SEVERE: Unable to walk without falling, or requires assistance to walk without falling; feels like passing out now.      Mild  Walking normally. I took a Z Pack to see if it helped but it didn't. 5. ONSET:  "When did the dizziness begin?"     2 weeks 6. AGGRAVATING FACTORS: "Does anything make it worse?" (e.g., standing, change in head position)     Sudden head movements. 7. HEART RATE: "Can you tell me your heart rate?" "How many beats in 15 seconds?"  (Note: not all patients can do this)       Not asked 8. CAUSE: "What do you think is causing the dizziness?"      Maybe inner ear but I don't really know 9. RECURRENT SYMPTOM: "Have you had dizziness before?" If Yes, ask: "When was the last time?" "What happened that time?"     No headaches.   Stress at work seems to make  It worse.   By BP and glucose are always fine when I'm dizzy and check them.  Stress seems to make the dizziness worse.   BP 105/75. 10. OTHER SYMPTOMS: "Do you have any other symptoms?" (e.g., fever, chest pain, vomiting, diarrhea, bleeding)       No illness 11. PREGNANCY: "Is there any chance you are pregnant?" "When was your last menstrual period?"       It's possible but I'm on birth control.  Protocols used: DIZZINESS [facility]

## 2020-08-27 NOTE — Telephone Encounter (Signed)
FYI. KW 

## 2020-08-28 NOTE — Progress Notes (Signed)
Established patient visit   Patient: Jenna Wright   DOB: 1981/05/10   40 y.o. Female  MRN: [id number] Visit Date: 08/29/2020  Today's healthcare provider: Trey Sailors, PA-C   Chief Complaint  Patient presents with  . Dizziness   Subjective      Dizziness  She reports new onset dizziness. She describes it as feeling unbalanced, occurs intermittently, and typically lasts few minutes but not specified.  It typically occurs when she is lying still, tilting head down and bending forward. It is usually relieved by sitting still. She has not started new medications around the time the dizziness started.  She had a first episode a few days ago when she was on the computer at work. She felt an episode where she felt the blood rush to her head and felt dizzy after that. She will feel worsening symptoms when she moves her head, bend over, and drivers through a pot hole. Symptoms worse at night when she lies down. Yesterday she felt the worst she had ever felt. There will periods of relief between symptoms. No nausea, the room is not spinning. She did have ringing in her left ear. No upper respiratory symptoms. She has taken a z pack for sinus infection just recently but this did not help. She is also taking a claritin which did not help. She has never had vertigo before. She did try the epley maneuver on herself - the first time she did this she felt more dizzy and then after this she felt better.   Associated symptoms: No hearing loss Yes tinnitus  No chest discomfort No heart palpitations  No heart racing No numbness or tingling of extremities  No nausea No vomiting  No speech difficulty No visual changes    Wt Readings from Last 3 Encounters:  08/29/20 233 lb (105.7 kg)  01/01/20 233 lb (105.7 kg)  10/02/19 232 lb (105.2 kg)    BP Readings from Last 3 Encounters:  08/29/20 (!) 141/83  01/01/20 122/74  10/02/19 117/80      Lab Results  Component Value Date   WBC 9.8  10/02/2019   HGB 13.3 10/02/2019   HCT 39.6 10/02/2019   MCV 86 10/02/2019   PLT 371 10/02/2019   Lab Results  Component Value Date   NA 142 10/02/2019   K 4.0 10/02/2019   CO2 23 10/02/2019   BUN 16 10/02/2019   CREATININE 0.86 10/02/2019   CALCIUM 9.9 10/02/2019   GLUCOSE 84 10/02/2019     ---------------------------------------------------------------------------------------------------      Medications: Outpatient Medications Prior to Visit  Medication Sig  . clobetasol cream (TEMOVATE) 0.05 % Apply 1 application topically 2 (two) times daily.  . drospirenone-ethinyl estradiol (YAZ) 3-0.02 MG tablet Take 1 tablet by mouth daily.   No facility-administered medications prior to visit.    Review of Systems  HENT: Positive for tinnitus.   Neurological: Positive for dizziness.       Objective    BP (!) 141/83 (BP Location: Left Arm, Patient Position: Sitting, Cuff Size: Large)   Pulse 81   Temp 98.3 F (36.8 C) (Oral)   Ht 5\' 7"  (1.702 m)   Wt 233 lb (105.7 kg)   SpO2 99%   BMI 36.49 kg/m     Physical Exam HENT:     Right Ear: Tympanic membrane and ear canal normal.     Left Ear: Tympanic membrane and ear canal normal.  Eyes:     Extraocular  Movements: Extraocular movements intact.     Pupils: Pupils are equal, round, and reactive to light.  Cardiovascular:     Rate and Rhythm: Normal rate and regular rhythm.  Pulmonary:     Effort: Pulmonary effort is normal.     Breath sounds: Normal breath sounds.  Skin:    General: Skin is warm and dry.  Neurological:     General: No focal deficit present.     Mental Status: She is oriented to person, place, and time.  Psychiatric:        Mood and Affect: Mood normal.        Behavior: Behavior normal.       No results found for any visits on 08/29/20.  Assessment & Plan    1. Vertigo  - meclizine (ANTIVERT) 25 MG tablet; Take 1 tablet (25 mg total) by mouth 3 (three) times daily as needed for dizziness.   Dispense: 30 tablet; Refill: 0   Return if symptoms worsen or fail to improve.      ITrey Sailors, PA-C, have reviewed all documentation for this visit. The documentation on 08/29/20 for the exam, diagnosis, procedures, and orders are all accurate and complete.  The entirety of the information documented in the History of Present Illness, Review of Systems and Physical Exam were personally obtained by me. Portions of this information were initially documented by Paticia Stack, CMA and reviewed by me for thoroughness and accuracy.         Maryella Shivers  [facility] [phone] (phone) [phone] (fax)  [facility]

## 2020-08-29 ENCOUNTER — Other Ambulatory Visit: Payer: Self-pay | Admitting: Physician Assistant

## 2020-08-29 ENCOUNTER — Ambulatory Visit (INDEPENDENT_AMBULATORY_CARE_PROVIDER_SITE_OTHER): Payer: No Typology Code available for payment source | Admitting: Physician Assistant

## 2020-08-29 ENCOUNTER — Other Ambulatory Visit: Payer: Self-pay

## 2020-08-29 VITALS — BP 141/83 | HR 81 | Temp 98.3°F | Ht 67.0 in | Wt 233.0 lb

## 2020-08-29 DIAGNOSIS — R42 Dizziness and giddiness: Secondary | ICD-10-CM | POA: Diagnosis not present

## 2020-08-29 MED ORDER — MECLIZINE HCL 25 MG PO TABS: 25.0000 mg | ORAL_TABLET | Freq: Three times a day (TID) | ORAL | 0 refills | Status: DC | PRN

## 2020-09-30 NOTE — Patient Instructions (Signed)
I value your feedback and you entrusting us with your care. If you get a [facility] patient survey, I would appreciate you taking the time to let us know about your experience today. Thank you!  [facility]: [phone]  [facility]: [phone]    

## 2020-09-30 NOTE — Progress Notes (Signed)
PCP:  Erasmo Downer, MD   Chief Complaint  Patient presents with  . Gynecologic Exam    No concerns     HPI:      [name] is a 40 y.o. G0P0000 who LMP was Patient's last menstrual period was 09/25/2020 (approximate)., presents today for her annual examination.  Her menses are absent now with OCPs, or has occas spotting for a few days on placebo pills  Dysmenorrhea none. She does not have intermenstrual bleeding. Has menstrual migraines 1 wk before menses, but less severe; changed to yaz with better sx control.   Sex activity: single partner, contraception - OCP (estrogen/progesterone).  Last Pap: 09/21/17  Results were: no abnormalities /neg HPV DNA  Hx of STDs: none  There is no FH of breast cancer. There is no FH of ovarian cancer. The patient does do occas self-breast exams.  Tobacco use: The patient denies current or previous tobacco use. Alcohol use: none No drug use.  Exercise: very active  She does get adequate calcium and Vitamin D in her diet. Labs with PCP.   Past Medical History:  Diagnosis Date  . Allergic rhinitis   . Menstrual migraine   . Obesity   . Psoriasis     Past Surgical History:  Procedure Laterality Date  . CHOLECYSTECTOMY      Family History  Problem Relation Age of Onset  . Diabetes type II Father   . Basal cell carcinoma Maternal Grandmother   . Colon cancer Maternal Grandmother 80  . Congestive Heart Failure Maternal Grandmother   . Lung cancer Maternal Grandmother 80  . Atrial fibrillation Maternal Grandmother   . Alzheimer's disease Maternal Grandfather   . Autoimmune disease Mother   . Diabetes Paternal Grandmother   . Psoriasis Maternal Aunt        with psoriatic arthritis  . Melanoma Maternal Uncle        with brain mets    Social History   Socioeconomic History  . Marital status: Single    Spouse name: Not on file  . Number of children: 0  . Years of education: Not on file  . Highest education  level: Not on file  Occupational History  . Occupation: dietician  Tobacco Use  . Smoking status: Former Smoker    Packs/day: 0.50    Years: 2.00    Pack years: 1.00    Types: Cigarettes    Quit date: 2000    Years since quitting: 22.2  . Smokeless tobacco: Never Used  Vaping Use  . Vaping Use: Never used  Substance and Sexual Activity  . Alcohol use: No  . Drug use: No  . Sexual activity: Yes    Partners: Male    Birth control/protection: Pill  Other Topics Concern  . Not on file  Social History Narrative  . Not on file   Social Determinants of Health   Financial Resource Strain: Not on file  Food Insecurity: Not on file  Transportation Needs: Not on file  Physical Activity: Not on file  Stress: Not on file  Social Connections: Not on file  Intimate Partner Violence: Not on file    Current Meds  Medication Sig  . clobetasol cream (TEMOVATE) 0.05 % Apply 1 application topically 2 (two) times daily.  . meclizine (ANTIVERT) 25 MG tablet Take 1 tablet (25 mg total) by mouth 3 (three) times daily as needed for dizziness.  . [DISCONTINUED] drospirenone-ethinyl estradiol (YAZ) 3-0.02 MG tablet Take 1  tablet by mouth daily.     ROS:  Review of Systems  Constitutional: Negative for fatigue, fever and unexpected weight change.  Respiratory: Negative for cough, shortness of breath and wheezing.   Cardiovascular: Negative for chest pain, palpitations and leg swelling.  Gastrointestinal: Negative for blood in stool, constipation, diarrhea, nausea and vomiting.  Endocrine: Negative for cold intolerance, heat intolerance and polyuria.  Genitourinary: Negative for dyspareunia, dysuria, flank pain, frequency, genital sores, hematuria, menstrual problem, pelvic pain, urgency, vaginal bleeding, vaginal discharge and vaginal pain.  Musculoskeletal: Negative for back pain, joint swelling and myalgias.  Skin: Negative for rash.  Neurological: Negative for dizziness, syncope,  light-headedness, numbness and headaches.  Hematological: Negative for adenopathy.  Psychiatric/Behavioral: Negative for agitation, confusion, sleep disturbance and suicidal ideas. The patient is not nervous/anxious.      Objective: BP 124/76   Ht 5\' 7"  (1.702 m)   Wt 239 lb (108.4 kg)   LMP 09/25/2020 (Approximate)   BMI 37.43 kg/m    Physical Exam Constitutional:      Appearance: She is well-developed.  Genitourinary:     Vulva normal.     Right Labia: No rash, tenderness or lesions.    Left Labia: No tenderness, lesions or rash.    No vaginal discharge, erythema or tenderness.      Right Adnexa: not tender and no mass present.    Left Adnexa: not tender and no mass present.    No cervical motion tenderness, friability or polyp.     Uterus is not enlarged or tender.  Breasts:     Right: No mass, nipple discharge, skin change or tenderness.     Left: No mass, nipple discharge, skin change or tenderness.    Neck:     Thyroid: No thyromegaly.  Cardiovascular:     Rate and Rhythm: Normal rate and regular rhythm.     Heart sounds: Normal heart sounds. No murmur heard.   Pulmonary:     Effort: Pulmonary effort is normal.     Breath sounds: Normal breath sounds.  Abdominal:     Palpations: Abdomen is soft.     Tenderness: There is no abdominal tenderness. There is no guarding or rebound.  Musculoskeletal:        General: Normal range of motion.     Cervical back: Normal range of motion.  Lymphadenopathy:     Cervical: No cervical adenopathy.  Neurological:     General: No focal deficit present.     Mental Status: She is alert and oriented to person, place, and time.     Cranial Nerves: No cranial nerve deficit.  Skin:    General: Skin is warm and dry.  Psychiatric:        Mood and Affect: Mood normal.        Behavior: Behavior normal.        Thought Content: Thought content normal.        Judgment: Judgment normal.  Vitals reviewed.      Assessment/Plan: Encounter for annual routine gynecological examination  Encounter for surveillance of contraceptive pills - Plan: drospirenone-ethinyl estradiol (YAZ) 3-0.02 MG tablet; OCP RF  Encounter for screening mammogram for malignant neoplasm of breast - Plan: MM 3D SCREEN BREAST BILATERAL; pt to sched mammo.   Meds ordered this encounter  Medications  . drospirenone-ethinyl estradiol (YAZ) 3-0.02 MG tablet    Sig: Take 1 tablet by mouth daily.    Dispense:  84 tablet    Refill:  3  Order Specific Question:   Supervising Provider    Answer:   Nadara Mustard [[id number]]            GYN counsel adequate intake of calcium and vitamin D     F/U  Return in about 1 year (around 10/01/2021).  Rolando Whitby B. Tequia Wolman, PA-C 10/01/2020 9:34 AM

## 2020-10-01 ENCOUNTER — Encounter: Payer: Self-pay | Admitting: Obstetrics and Gynecology

## 2020-10-01 ENCOUNTER — Other Ambulatory Visit: Payer: Self-pay

## 2020-10-01 ENCOUNTER — Other Ambulatory Visit: Payer: Self-pay | Admitting: Obstetrics and Gynecology

## 2020-10-01 ENCOUNTER — Ambulatory Visit (INDEPENDENT_AMBULATORY_CARE_PROVIDER_SITE_OTHER): Payer: No Typology Code available for payment source | Admitting: Obstetrics and Gynecology

## 2020-10-01 VITALS — BP 124/76 | Ht 67.0 in | Wt 239.0 lb

## 2020-10-01 DIAGNOSIS — Z01419 Encounter for gynecological examination (general) (routine) without abnormal findings: Secondary | ICD-10-CM | POA: Diagnosis not present

## 2020-10-01 DIAGNOSIS — Z3041 Encounter for surveillance of contraceptive pills: Secondary | ICD-10-CM | POA: Diagnosis not present

## 2020-10-01 DIAGNOSIS — Z1231 Encounter for screening mammogram for malignant neoplasm of breast: Secondary | ICD-10-CM | POA: Diagnosis not present

## 2020-10-01 MED ORDER — DROSPIRENONE-ETHINYL ESTRADIOL 3-0.02 MG PO TABS: 1.0000 | ORAL_TABLET | Freq: Every day | ORAL | 3 refills | Status: DC

## 2020-10-03 ENCOUNTER — Encounter: Payer: Self-pay | Admitting: Family Medicine

## 2020-11-18 ENCOUNTER — Other Ambulatory Visit: Payer: Self-pay

## 2020-11-18 MED FILL — Drospirenone-Ethinyl Estradiol Tab 3-0.02 MG: ORAL | 84 days supply | Qty: 84 | Fill #0 | Status: AC

## 2020-11-21 ENCOUNTER — Ambulatory Visit
Admission: RE | Admit: 2020-11-21 | Discharge: 2020-11-21 | Disposition: A | Discharge status: Home / Self Care | Payer: No Typology Code available for payment source | Source: Ambulatory Visit | Attending: Obstetrics and Gynecology | Admitting: Obstetrics and Gynecology

## 2020-11-21 ENCOUNTER — Other Ambulatory Visit: Payer: Self-pay

## 2020-11-21 DIAGNOSIS — Z1231 Encounter for screening mammogram for malignant neoplasm of breast: Secondary | ICD-10-CM | POA: Diagnosis not present

## 2021-02-04 ENCOUNTER — Other Ambulatory Visit: Payer: Self-pay

## 2021-02-04 MED FILL — Drospirenone-Ethinyl Estradiol Tab 3-0.02 MG: ORAL | 84 days supply | Qty: 84 | Fill #1 | Status: AC

## 2021-02-05 ENCOUNTER — Other Ambulatory Visit: Payer: Self-pay

## 2021-03-20 ENCOUNTER — Ambulatory Visit: Payer: Self-pay | Admitting: *Deleted

## 2021-03-20 NOTE — Telephone Encounter (Signed)
Upper centered abdominal pain"pinching" that moves to the umbilicus since June. Comes and goes and last one-two days when occurs. Reported this began after doing some tough exercising while at the beach in June. Denies CP/SOB/dizziness/N/V. No difficulty voiding and no fever. Patient is okay with waiting until 1st available with Dr. Beryle Flock if within a month. Offered earlier appointment with D.Chrismon but declined.     Reason for Disposition  Abdominal pain is a chronic symptom (recurrent or ongoing AND present > 4 weeks)  Answer Assessment - Initial Assessment Questions 1. LOCATION: "Where does it hurt?"      Upper abdomen down to umbilicus. 2. RADIATION: "Does the pain shoot anywhere else?" (e.g., chest, back)     Upper to lower abdomen 3. ONSET: "When did the pain begin?" (e.g., minutes, hours or days ago)      Began in June 4. SUDDEN: "Gradual or sudden onset?"     gradual 5. PATTERN "Does the pain come and go, or is it constant?"    - If constant: "Is it getting better, staying the same, or worsening?"      (Note: Constant means the pain never goes away completely; most serious pain is constant and it progresses)     - If intermittent: "How long does it last?" "Do you have pain now?"     (Note: Intermittent means the pain goes away completely between bouts)     Comes and goes 6. SEVERITY: "How bad is the pain?"  (e.g., Scale 1-10; mild, moderate, or severe)    - MILD (1-3): doesn't interfere with normal activities, abdomen soft and not tender to touch     - MODERATE (4-7): interferes with normal activities or awakens from sleep, abdomen tender to touch     - SEVERE (8-10): excruciating pain, doubled over, unable to do any normal activities       mild 7. RECURRENT SYMPTOM: "Have you ever had this type of stomach pain before?" If Yes, ask: "When was the last time?" and "What happened that time?"      no 8. AGGRAVATING FACTORS: "Does anything seem to cause this pain?" (e.g., foods,  stress, alcohol)     Some movements 9. CARDIAC SYMPTOMS: "Do you have any of the following symptoms: chest pain, difficulty breathing, sweating, nausea?"     no 10. OTHER SYMPTOMS: "Do you have any other symptoms?" (e.g., back pain, diarrhea, fever, urination pain, vomiting)       no 11. PREGNANCY: "Is there any chance you are pregnant?" "When was your last menstrual period?"       no  Protocols used: Abdominal Pain - Upper-A-AH

## 2021-03-20 NOTE — Telephone Encounter (Signed)
Appt scheduled for 04/18/21 at 9:20am

## 2021-03-21 ENCOUNTER — Other Ambulatory Visit: Payer: Self-pay

## 2021-03-21 ENCOUNTER — Emergency Department: Payer: No Typology Code available for payment source

## 2021-03-21 ENCOUNTER — Emergency Department
Admission: EM | Admit: 2021-03-21 | Discharge: 2021-03-21 | Disposition: A | Discharge status: Home / Self Care | Payer: No Typology Code available for payment source | Attending: Emergency Medicine | Admitting: Emergency Medicine

## 2021-03-21 DIAGNOSIS — K529 Noninfective gastroenteritis and colitis, unspecified: Secondary | ICD-10-CM | POA: Diagnosis not present

## 2021-03-21 DIAGNOSIS — Z87891 Personal history of nicotine dependence: Secondary | ICD-10-CM | POA: Insufficient documentation

## 2021-03-21 DIAGNOSIS — R1013 Epigastric pain: Secondary | ICD-10-CM | POA: Diagnosis present

## 2021-03-21 DIAGNOSIS — R1084 Generalized abdominal pain: Secondary | ICD-10-CM

## 2021-03-21 LAB — COMPREHENSIVE METABOLIC PANEL
ALT: 22 U/L (ref 0–44)
AST: 17 U/L (ref 15–41)
Albumin: 3.8 g/dL (ref 3.5–5.0)
Alkaline Phosphatase: 68 U/L (ref 38–126)
Anion gap: 8 (ref 5–15)
BUN: 16 mg/dL (ref 6–20)
CO2: 24 mmol/L (ref 22–32)
Calcium: 9 mg/dL (ref 8.9–10.3)
Chloride: 107 mmol/L (ref 98–111)
Creatinine, Ser: 0.77 mg/dL (ref 0.44–1.00)
GFR, Estimated: >60 mL/min (ref >60)
Glucose, Bld: 105 mg/dL — ABNORMAL HIGH (ref 70–99)
Potassium: 3.7 mmol/L (ref 3.5–5.1)
Sodium: 139 mmol/L (ref 135–145)
Total Bilirubin: 0.4 mg/dL (ref 0.3–1.2)
Total Protein: 7.4 g/dL (ref 6.5–8.1)

## 2021-03-21 LAB — URINALYSIS, COMPLETE (UACMP) WITH MICROSCOPIC
Bacteria, UA: NONE SEEN
Bilirubin Urine: NEGATIVE
Glucose, UA: NEGATIVE mg/dL
Ketones, ur: NEGATIVE mg/dL
Leukocytes,Ua: NEGATIVE
Nitrite: NEGATIVE
Protein, ur: NEGATIVE mg/dL
Specific Gravity, Urine: 1.025 (ref 1.005–1.030)
pH: 6 (ref 5.0–8.0)

## 2021-03-21 LAB — CBC
HCT: 39.8 % (ref 36.0–46.0)
Hemoglobin: 13.8 g/dL (ref 12.0–15.0)
MCH: 29.9 pg (ref 26.0–34.0)
MCHC: 34.7 g/dL (ref 30.0–36.0)
MCV: 86.1 fL (ref 80.0–100.0)
Platelets: 354 10*3/uL (ref 150–400)
RBC: 4.62 MIL/uL (ref 3.87–5.11)
RDW: 13.4 % (ref 11.5–15.5)
WBC: 9 10*3/uL (ref 4.0–10.5)
nRBC: 0 % (ref 0.0–0.2)

## 2021-03-21 LAB — LIPASE, BLOOD: Lipase: 29 U/L (ref 11–51)

## 2021-03-21 LAB — POC URINE PREG, ED: Preg Test, Ur: NEGATIVE

## 2021-03-21 MED ORDER — PREDNISONE 20 MG PO TABS: 60.0000 mg | ORAL_TABLET | Freq: Once | ORAL | Status: DC

## 2021-03-21 MED ORDER — PREDNISONE 10 MG PO TABS
10.0000 mg | ORAL_TABLET | ORAL | 0 refills | Status: DC
  Filled 2021-03-25: qty 42, 12d supply, fill #0

## 2021-03-21 MED ORDER — IOHEXOL 350 MG/ML SOLN
100.0000 mL | Freq: Once | INTRAVENOUS | Status: AC | PRN
  Administered 2021-03-21: 100 mL via INTRAVENOUS
  Filled 2021-03-21: qty 100

## 2021-03-21 MED ORDER — DICYCLOMINE HCL 10 MG PO CAPS
10.0000 mg | ORAL_CAPSULE | Freq: Four times a day (QID) | ORAL | 0 refills | Status: DC
  Filled 2021-03-25: qty 56, 14d supply, fill #0

## 2021-03-21 NOTE — ED Notes (Signed)
Patient transported to CT 

## 2021-03-21 NOTE — ED Triage Notes (Signed)
Pt c/o ABD pain that has been going on since June. Pt states it has been worse the past few days. Pt states pain is from epigastric area to umbilical area. Pt denies fevers or chills. Pt denies N/V/D.

## 2021-03-21 NOTE — ED Provider Notes (Signed)
[facility] Emergency Department Provider Note  ____________________________________________  Time seen: Approximately 6:58 PM  I have reviewed the triage vital signs and the nursing notes.   HISTORY  Chief Complaint Abdominal Pain    HPI Jenna Wright is a 40 y.o. female who presents the emergency department complaining of epigastric abdominal pain.  Patient states that she has been having some symptoms since June when she went to the beach.  Patient states that before she went to the beach she had had no issues with abdominal symptoms.  She does have some issues with chronic diarrhea secondary to having a cholecystectomy.  However patient does not typically experience any abdominal pain.  Initially she thought she had strained her abdominal wall while at the beach doing activities there that she had no acute injury that she was aware of.  She thought it would improve symptoms have persisted.  She states that it does seem to be worsened or alleviated by movement.  There is no heartburn type symptoms, emesis.  She does have chronic diarrhea but no changes from same.  No constipation.  Patient has not noticed any bulging of the abdominal wall and cannot palpate anything along her abdomen.  She denies any other complaints at this time.  No urinary symptoms.  No chest pain.  Patient states that she tried to be seen by her primary care however it was going to take an additional 2 months to get into see primary care.       Past Medical History:  Diagnosis Date   Allergic rhinitis    Menstrual migraine    Obesity    Psoriasis     Patient Active Problem List   Diagnosis Date Noted   Psoriasis    Allergic rhinitis     Past Surgical History:  Procedure Laterality Date   CHOLECYSTECTOMY      Prior to Admission medications   Medication Sig Start Date End Date Taking? Authorizing Provider  dicyclomine (BENTYL) 10 MG capsule Take 1 capsule (10 mg total) by mouth  4 (four) times daily for 14 days. 03/21/21 04/04/21 Yes Damarcus Reggio, Delorise Royals, PA-C  predniSONE (DELTASONE) 10 MG tablet Take 1 tablet (10 mg total) by mouth as directed. 03/21/21  Yes Martie Muhlbauer, Delorise Royals, PA-C  clobetasol cream (TEMOVATE) 0.05 % Apply 1 application topically 2 (two) times daily. 08/02/18   Erasmo Downer, MD  drospirenone-ethinyl estradiol (YAZ) 3-0.02 MG tablet TAKE 1 TABLET BY MOUTH DAILY. 10/01/20 10/01/21  Copland, Ilona Sorrel, PA-C  meclizine (ANTIVERT) 25 MG tablet TAKE 1 TABLET BY MOUTH 3 TIMES DAILY AS NEEDED FOR DIZZINESS. 08/29/20 08/29/21  Trey Sailors, PA-C    Allergies Other and Penicillins  Family History  Problem Relation Age of Onset   Diabetes type II Father    Basal cell carcinoma Maternal Grandmother    Colon cancer Maternal Grandmother 74   Congestive Heart Failure Maternal Grandmother    Lung cancer Maternal Grandmother 80   Atrial fibrillation Maternal Grandmother    Alzheimer's disease Maternal Grandfather    Autoimmune disease Mother    Diabetes Paternal Grandmother    Psoriasis Maternal Aunt        with psoriatic arthritis   Melanoma Maternal Uncle        with brain mets   Breast cancer Neg Hx     Social History Social History   Tobacco Use   Smoking status: Former    Packs/day: 0.50    Years: 2.00  Pack years: 1.00    Types: Cigarettes    Quit date: 2000    Years since quitting: 22.6   Smokeless tobacco: Never  Vaping Use   Vaping Use: Never used  Substance Use Topics   Alcohol use: No   Drug use: No     Review of Systems  Constitutional: No fever/chills Eyes: No visual changes. No discharge ENT: No upper respiratory complaints. Cardiovascular: no chest pain. Respiratory: no cough. No SOB. Gastrointestinal: Ongoing epigastric abdominal pain.  No nausea, no vomiting.  Chronic diarrhea with no changes from same.  No constipation. Genitourinary: Negative for dysuria. No hematuria Musculoskeletal: Negative for  musculoskeletal pain. Skin: Negative for rash, abrasions, lacerations, ecchymosis. Neurological: Negative for headaches, focal weakness or numbness.  10 System ROS otherwise negative.  ____________________________________________   PHYSICAL EXAM:  VITAL SIGNS: ED Triage Vitals  Enc Vitals Group     BP 03/21/21 1807 (!) 159/92     Pulse Rate 03/21/21 1807 97     Resp --      Temp 03/21/21 1807 98.7 F (37.1 C)     Temp Source 03/21/21 1807 Oral     SpO2 03/21/21 1807 100 %     Weight 03/21/21 1812 228 lb (103.4 kg)     Height 03/21/21 1812 5\' 7"  (1.702 m)     Head Circumference --      Peak Flow --      Pain Score 03/21/21 1812 3     Pain Loc --      Pain Edu? --      Excl. in GC? --      Constitutional: Alert and oriented. Well appearing and in no acute distress. Eyes: Conjunctivae are normal. PERRL. EOMI. Head: Atraumatic. ENT:      Ears:       Nose: No congestion/rhinnorhea.      Mouth/Throat: Mucous membranes are moist.  Neck: No stridor.   Hematological/Lymphatic/Immunilogical: No cervical lymphadenopathy. Cardiovascular: Normal rate, regular rhythm. Normal S1 and S2.  Good peripheral circulation. Respiratory: Normal respiratory effort without tachypnea or retractions. Lungs CTAB. Good air entry to the bases with no decreased or absent breath sounds. Gastrointestinal: No visible external abdominal wall findings.  Specifically no bulging consistent with large ventral hernia.  Bowel sounds 4 quadrants. Soft and nontender to palpation all quadrants.  She states there is some discomfort with palpation of the epigastric region but but no frank tenderness.. No guarding or rigidity. No palpable masses intra-abdominal he.  No palpable lesions along the external wall to be concerned with ventral hernia. No distention. No CVA tenderness. Musculoskeletal: Full range of motion to all extremities. No gross deformities appreciated. Neurologic:  Normal speech and language. No gross  focal neurologic deficits are appreciated.  Skin:  Skin is warm, dry and intact. No rash noted. Psychiatric: Mood and affect are normal. Speech and behavior are normal. Patient exhibits appropriate insight and judgement.   ____________________________________________   LABS (all labs ordered are listed, but only abnormal results are displayed)  Labs Reviewed  COMPREHENSIVE METABOLIC PANEL - Abnormal; Notable for the following components:      Result Value   Glucose, Bld 105 (*)    All other components within normal limits  URINALYSIS, COMPLETE (UACMP) WITH MICROSCOPIC - Abnormal; Notable for the following components:   Color, Urine YELLOW (*)    APPearance CLEAR (*)    Hgb urine dipstick SMALL (*)    All other components within normal limits  LIPASE, BLOOD  CBC  POC URINE PREG, ED   ____________________________________________  EKG   ____________________________________________  RADIOLOGY I personally viewed and evaluated these images as part of my medical decision making, as well as reviewing the written report by the radiologist.  ED Provider Interpretation: Visualization of CT scan revealed inflammatory changes consistent with colitis.  No evidence of perforation.  Diverticulosis without diverticulitis.  Gallbladder surgically absent  CT ABDOMEN PELVIS W CONTRAST  Result Date: 03/21/2021 CLINICAL DATA:  Worsening epigastric pain since June. EXAM: CT ABDOMEN AND PELVIS WITH CONTRAST TECHNIQUE: Multidetector CT imaging of the abdomen and pelvis was performed using the standard protocol following bolus administration of intravenous contrast. CONTRAST:  OMNIPAQUE IOHEXOL 350 MG/ML SOLN COMPARISON:  None. FINDINGS: Lower chest: No acute abnormality. Hepatobiliary: No suspicious hepatic lesion. Gallbladder surgically absent. No biliary ductal dilation. Pancreas: Fatty atrophy of the tail the pancreas. No pancreatic ductal dilation. No evidence of acute inflammation. Spleen:  Within normal limits. Adrenals/Urinary Tract: Bilateral adrenal glands are unremarkable. No hydronephrosis. No solid enhancing renal mass. Urinary bladder is decompressed. Stomach/Bowel: Stomach is unremarkable for degree of distension. No pathologic dilation of small or large bowel. Mild wall thickening of prominent loops of small bowel the left upper quadrant. Colonic diverticulosis without findings of acute diverticulitis. The appendix and terminal ileum appear normal. Vascular/Lymphatic: No abdominal aortic aneurysm. No pathologically enlarged abdominal or pelvic lymph nodes. Reproductive: Uterus and bilateral adnexa are unremarkable. Other: No abdominopelvic ascites. Musculoskeletal: No acute or significant osseous findings. IMPRESSION: 1. Mild wall thickening of prominent loops of small bowel in the left upper quadrant may represent infectious or inflammatory enteritis. 2. Colonic diverticulosis without findings of acute diverticulitis. Electronically Signed   By: Maudry Mayhew M.D.   On: 03/21/2021 23:20    ____________________________________________    PROCEDURES  Procedure(s) performed:    Procedures    Medications  predniSONE (DELTASONE) tablet 60 mg (has no administration in time range)  iohexol (OMNIPAQUE) 350 MG/ML injection 100 mL (100 mLs Intravenous Contrast Given 03/21/21 2253)     ____________________________________________   INITIAL IMPRESSION / ASSESSMENT AND PLAN / ED COURSE  Pertinent labs & imaging results that were available during my care of the patient were reviewed by me and considered in my medical decision making (see chart for details).  Review of the [place] CSRS was performed in accordance of the [facility] prior to dispensing any controlled drugs.           Patient's diagnosis is consistent with abdominal pain and colitis.  Patient presented to the emergency department complaining of ongoing epigastric and left upper quadrant and occasionally right upper  quadrant abdominal pain x3 months.  Patient has chronic diarrhea after her cholecystectomy and no changes from baseline.  No fevers or chills.  Patient has no emesis.  No history of irritable bowel syndrome, ulcerative colitis or Crohn's.  Patient was slightly tender in the upper quadrant without any guarding.  Labs are reassuring, CT scan was obtained which revealed dilated loops consistent with colitis.  Concern at this time as patient has other autoimmune conditions that this may be ulcerative colitis, Crohn's or irritable bowel syndrome.  At this point with 3 months history, reassuring white blood cell count and no other infectious signs or symptoms I do not think that this is infectious bacterial colitis.  Patient will be treated with a course of steroid and Bentyl.  If symptoms do not improve I would recommend that the patient follow-up with GI.  Return precautions discussed with the patient.Marland Kitchen  Patient is given ED precautions to return to the ED for any worsening or new symptoms.     ____________________________________________  FINAL CLINICAL IMPRESSION(S) / ED DIAGNOSES  Final diagnoses:  Generalized abdominal pain  Colitis      NEW MEDICATIONS STARTED DURING THIS VISIT:  ED Discharge Orders          Ordered    predniSONE (DELTASONE) 10 MG tablet  As directed       Note to Pharmacy: Take on a pattern of 6, 6, 5, 5, 4, 4, 3, 3, 2, 2, 1, 1   03/21/21 2344    dicyclomine (BENTYL) 10 MG capsule  4 times daily        03/21/21 2344                This chart was dictated using voice recognition software/Dragon. Despite best efforts to proofread, errors can occur which can change the meaning. Any change was purely unintentional.    Racheal Patches, PA-C 03/21/21 2346    Minna Antis, MD 03/30/21 1521

## 2021-03-25 ENCOUNTER — Other Ambulatory Visit: Payer: Self-pay

## 2021-03-25 LAB — POC URINE PREG, ED: Preg Test, Ur: NEGATIVE

## 2021-03-26 ENCOUNTER — Telehealth: Payer: Self-pay

## 2021-03-26 NOTE — Telephone Encounter (Signed)
Copied from CRM [id number]. Topic: Referral - Request for Referral >> Mar 26, 2021  4:32 PM Gwenlyn Fudge wrote: Has patient seen PCP for this complaint? No. Pt was seen in Ed for this issue *If NO, is insurance requiring patient see PCP for this issue before PCP can refer them? Referral for which specialty: GI Preferred provider/office: Dr. Wonda Amis [facility]  Reason for referral: Pt called stating that she was seen in the ED on 03/21/21 regarding stomach issues. She states that she was diagnosed with enflamed bowels. Pt was referred to [facility] and she is needing to have someone in [place] due to insurance. Please advise.

## 2021-03-27 ENCOUNTER — Other Ambulatory Visit: Payer: Self-pay

## 2021-03-27 DIAGNOSIS — R109 Unspecified abdominal pain: Secondary | ICD-10-CM

## 2021-03-27 DIAGNOSIS — K529 Noninfective gastroenteritis and colitis, unspecified: Secondary | ICD-10-CM

## 2021-03-27 NOTE — Telephone Encounter (Signed)
Ok to place referral for [facility]

## 2021-04-07 ENCOUNTER — Telehealth: Payer: Self-pay

## 2021-04-07 NOTE — Telephone Encounter (Signed)
Copied from CRM [id number]. Topic: General - Other >> Apr 07, 2021  8:32 AM Jenna Wright A wrote: Reason for CRM: The patient has called to share that they're abdominal discomfort  is increasing now that they have decreased the amount of prednisone they're taking  The patient shares that they' have a previous history of chronic inflammation   The patient would like to know if it is possible to be seen sooner than 04/18/21 to discuss their inflammation   Please contact further when possible

## 2021-04-08 NOTE — Telephone Encounter (Signed)
There are no appointments available.

## 2021-04-17 ENCOUNTER — Other Ambulatory Visit: Payer: Self-pay

## 2021-04-17 ENCOUNTER — Encounter: Payer: Self-pay | Admitting: Family Medicine

## 2021-04-17 ENCOUNTER — Ambulatory Visit (INDEPENDENT_AMBULATORY_CARE_PROVIDER_SITE_OTHER): Payer: No Typology Code available for payment source | Admitting: Family Medicine

## 2021-04-17 VITALS — BP 118/74 | HR 85 | Resp 16 | Wt 222.6 lb

## 2021-04-17 DIAGNOSIS — K529 Noninfective gastroenteritis and colitis, unspecified: Secondary | ICD-10-CM

## 2021-04-17 DIAGNOSIS — R1013 Epigastric pain: Secondary | ICD-10-CM | POA: Diagnosis not present

## 2021-04-17 DIAGNOSIS — K219 Gastro-esophageal reflux disease without esophagitis: Secondary | ICD-10-CM

## 2021-04-17 MED ORDER — OMEPRAZOLE 20 MG PO CPDR
20.0000 mg | DELAYED_RELEASE_CAPSULE | Freq: Every day | ORAL | 3 refills | Status: DC
  Filled 2021-04-17: qty 30, 30d supply, fill #0

## 2021-04-17 MED ORDER — BUDESONIDE ER 9 MG PO TB24
9.0000 mg | ORAL_TABLET | Freq: Every day | ORAL | 0 refills | Status: DC
  Filled 2021-04-17 – 2021-04-28 (×3): qty 30, 30d supply, fill #0

## 2021-04-17 NOTE — Progress Notes (Signed)
Established patient visit   Patient: Jenna Wright   DOB: 12-10-1980   40 y.o. Female  MRN: [id number] Visit Date: 04/17/2021  Today's healthcare provider: Shirlee Latch, MD   Chief Complaint  Patient presents with   Abdominal Pain   Subjective    Abdominal Pain Associated symptoms include diarrhea. Pertinent negatives include no constipation, fever, frequency, headaches, myalgias, nausea or vomiting.    History was initially obtained by MS 2 Sue Lush  Follow up ER visit  She has a Hx of diarrhea secondary to having a cholecystectomy.   Patient was seen in ER for abdominal pain on 03/21/21. Based on the findings of the CT abdomen and pelvis the major findings showed colitis. She was recommended to follow-up with GI if symptoms don't improve.  She was treated for generalized abdominal pain/ colitis.She has had this pain since late June. She reports the pain is intermittent and worse with movement. Her symptoms have improved since she had COVID. She has occassionally diarrhea. She denies N/V and blood in stool.  Treatment for this included prednisone, and dicyclomine for IBD.    She reports excellent compliance with treatment. She reports this condition is Improved.  The abdominal pain has returned and symptoms worsen after eating foods high in fiber.   Since her recent ED visit there is a acute symptoms of heart burn. She uses toms to resolve symptoms. She is scheduled to follow-up with GI in November.   -----------------------------------------------------------------------------------------     Medications: Outpatient Medications Prior to Visit  Medication Sig   clobetasol cream (TEMOVATE) 0.05 % Apply 1 application topically 2 (two) times daily.   drospirenone-ethinyl estradiol (YAZ) 3-0.02 MG tablet TAKE 1 TABLET BY MOUTH DAILY.   [DISCONTINUED] dicyclomine (BENTYL) 10 MG capsule Take 1 capsule (10 mg total) by mouth 4 (four) times daily for 14 days.    [DISCONTINUED] meclizine (ANTIVERT) 25 MG tablet TAKE 1 TABLET BY MOUTH 3 TIMES DAILY AS NEEDED FOR DIZZINESS.   [DISCONTINUED] predniSONE (DELTASONE) 10 MG tablet Take 1 tablet (10 mg total) by mouth as directed.   No facility-administered medications prior to visit.    Review of Systems  Constitutional:  Positive for appetite change. Negative for chills, diaphoresis, fatigue and fever.  HENT:  Negative for sinus pressure, sinus pain and sore throat.   Eyes:  Negative for pain and visual disturbance.  Respiratory:  Negative for cough, chest tightness, shortness of breath and wheezing.   Cardiovascular:  Negative for chest pain, palpitations and leg swelling.  Gastrointestinal:  Positive for abdominal distention, abdominal pain and diarrhea. Negative for blood in stool, constipation, nausea and vomiting.  Genitourinary:  Negative for flank pain, frequency, pelvic pain and urgency.  Musculoskeletal:  Negative for back pain, myalgias and neck pain.  Neurological:  Negative for dizziness, light-headedness, numbness and headaches.       Objective    BP 118/74 (BP Location: Left Arm, Patient Position: Sitting, Cuff Size: Large)   Pulse 85   Resp 16   Wt 222 lb 9.6 oz (101 kg)   SpO2 98%   BMI 34.86 kg/m  BP Readings from Last 3 Encounters:  04/17/21 118/74  03/21/21 139/89  10/01/20 124/76   Wt Readings from Last 3 Encounters:  04/17/21 222 lb 9.6 oz (101 kg)  03/21/21 228 lb (103.4 kg)  10/01/20 239 lb (108.4 kg)      Physical Exam Vitals reviewed.  Constitutional:      General: She is not in  acute distress.    Appearance: Normal appearance. She is well-developed. She is not diaphoretic.  HENT:     Head: Normocephalic and atraumatic.  Eyes:     General: No scleral icterus.    Conjunctiva/sclera: Conjunctivae normal.  Neck:     Thyroid: No thyromegaly.  Cardiovascular:     Rate and Rhythm: Normal rate and regular rhythm.     Pulses: Normal pulses.     Heart sounds:  Normal heart sounds. No murmur heard. Pulmonary:     Effort: Pulmonary effort is normal. No respiratory distress.     Breath sounds: Normal breath sounds. No wheezing, rhonchi or rales.  Abdominal:     Tenderness: There is abdominal tenderness in the epigastric area.     Comments: mild tenderness/firmness   Musculoskeletal:     Cervical back: Neck supple.     Right lower leg: No edema.     Left lower leg: No edema.  Lymphadenopathy:     Cervical: No cervical adenopathy.  Skin:    General: Skin is warm and dry.     Findings: No rash.  Neurological:     Mental Status: She is alert and oriented to person, place, and time. Mental status is at baseline.  Psychiatric:        Mood and Affect: Mood normal.        Behavior: Behavior normal.      No results found for any visits on 04/17/21.  Assessment & Plan     Problem List Items Addressed This Visit   None Visit Diagnoses     Epigastric abdominal pain    -  Primary   Gastroesophageal reflux disease without esophagitis       Relevant Medications   omeprazole (PRILOSEC) 20 MG capsule   Enteritis         -Ongoing problems for the last 3 months or so - She did have some improvement in her epigastric pain and enteritis with prednisone - GERD seems to have started after taking prednisone - Start Prilosec 20 mg daily for GERD - Discussed dietary changes - Hydrate well - Trial of budesonide delayed release for recurrent enteritis symptoms - Red flags discussed - Upcoming GI appointment, where she will likely get further work-up including possible colonoscopy/EGD   Return in about 6 months (around 10/15/2021) for CPE.      I,Essence Turner,acting as a Neurosurgeon for Shirlee Latch, MD.,have documented all relevant documentation on the behalf of Shirlee Latch, MD,as directed by  Shirlee Latch, MD while in the presence of Shirlee Latch, MD.  I, Shirlee Latch, MD, have reviewed all documentation for this visit. The  documentation on 04/17/21 for the exam, diagnosis, procedures, and orders are all accurate and complete.   Ludmilla Mcgillis, Marzella Schlein, MD, MPH [facility]

## 2021-04-18 ENCOUNTER — Ambulatory Visit: Payer: No Typology Code available for payment source | Admitting: Family Medicine

## 2021-04-21 ENCOUNTER — Other Ambulatory Visit: Payer: Self-pay

## 2021-04-25 ENCOUNTER — Other Ambulatory Visit: Payer: Self-pay

## 2021-04-28 ENCOUNTER — Other Ambulatory Visit: Payer: Self-pay

## 2021-04-28 MED FILL — Drospirenone-Ethinyl Estradiol Tab 3-0.02 MG: ORAL | 84 days supply | Qty: 84 | Fill #2 | Status: AC

## 2021-04-30 ENCOUNTER — Telehealth: Payer: Self-pay

## 2021-04-30 ENCOUNTER — Other Ambulatory Visit: Payer: Self-pay | Admitting: Family Medicine

## 2021-04-30 MED ORDER — BUDESONIDE ER 9 MG PO TB24: 9.0000 mg | ORAL_TABLET | Freq: Every day | ORAL | 0 refills | Status: DC

## 2021-04-30 NOTE — Telephone Encounter (Signed)
Copied from CRM [id number]. Topic: General - Inquiry >> Apr 28, 2021  4:58 PM Daphine Deutscher D wrote: Pt called saying the [facility] pharmacy does not have the Budesnide on formulary at the pharmacy so she wants to know if Dr. B will send the medication to [facility] [address].  CB#  [phone]

## 2021-05-01 ENCOUNTER — Other Ambulatory Visit: Payer: Self-pay

## 2021-05-02 ENCOUNTER — Telehealth: Payer: Self-pay | Admitting: Family Medicine

## 2021-05-02 NOTE — Telephone Encounter (Signed)
Have we received PA?  If so, we should do it. If not, pharmacy needs to send one. We could start Prednisone 20mg  daily x14 days instead if unable to affordthis.

## 2021-05-02 NOTE — Telephone Encounter (Signed)
PA done. Patient does not want to start prednisone, reports that last time medication did not help. Patient reports she will wait on PA response.

## 2021-05-02 NOTE — Telephone Encounter (Signed)
Medication Budesonide ER 9 MG TB24 [[id number]]  Pt called and stated medication is $1400 and will need a PA done please advise :

## 2021-05-05 NOTE — Telephone Encounter (Signed)
Noted  

## 2021-05-14 ENCOUNTER — Other Ambulatory Visit: Payer: Self-pay

## 2021-05-26 ENCOUNTER — Other Ambulatory Visit: Payer: Self-pay

## 2021-05-26 ENCOUNTER — Encounter: Payer: Self-pay | Admitting: Gastroenterology

## 2021-05-26 ENCOUNTER — Ambulatory Visit (INDEPENDENT_AMBULATORY_CARE_PROVIDER_SITE_OTHER): Payer: No Typology Code available for payment source | Admitting: Gastroenterology

## 2021-05-26 VITALS — BP 145/84 | HR 92 | Temp 99.0°F | Ht 67.0 in | Wt 222.2 lb

## 2021-05-26 DIAGNOSIS — R1013 Epigastric pain: Secondary | ICD-10-CM | POA: Diagnosis not present

## 2021-05-26 DIAGNOSIS — K529 Noninfective gastroenteritis and colitis, unspecified: Secondary | ICD-10-CM | POA: Diagnosis not present

## 2021-05-26 NOTE — Progress Notes (Signed)
Arlyss Repress, MD [address]  [address]  [place], [place] [address]  Main: [phone]  Fax: [phone]    Gastroenterology Consultation  Referring Provider:     Erasmo Downer, MD Primary Care Physician:  Erasmo Downer, MD Primary Gastroenterologist:  Dr. Arlyss Repress Reason for Consultation:     Enteritis, epigastric pain        HPI:   Jenna Wright is a 40 y.o. female referred by Dr. Beryle Flock, Marzella Schlein, MD  for consultation & management of abdominal pain.  Patient went to ER on 03/20/2021 secondary to acute worsening of epigastric pain extending upto periumbilical area.  She noticed that the pain got worse after she returned from the beach in June.  She said she had high calorie foods when she went to beach, also did some tough exercising while at the beach in June.  Patient also reports that she has been having loose stools on Bristol stool scale 5-7 ever since she had a cholecystectomy.  In the ER, serum lipase, CMP, CBC, urinalysis, pregnancy test were all normal.  She underwent CT abdomen and pelvis with contrast which revealed mild wall thickening of prominent loops of small bowel in the left upper quadrant, colonic diverticulosis, fatty atrophy of the tail of the pancreas. Patient lost about 15 pounds during this episode and her weight has stabilized in last 1 month.  She does acknowledge that she tends to consume high carbohydrate diet even though she exercises regularly.  She is not able to exercise because of the abdominal pain.  Patient has family history of rheumatoid arthritis, psoriasis.  She also has pain in bilateral shoulder as well as hip joints.  Given CT findings and family history of autoimmune disease, she was told that she may have Crohn's disease and was given short course of prednisone.  Patient developed severe gastritis, heartburn while on prednisone use.  She was started on omeprazole 20 mg daily by Dr. Beryle Flock.  Patient reports that  her heartburn subsided after prednisone course.  She was suggested to try budesonide but it was expensive, she waited to be seen by GI.  Patient is particularly concerned about postprandial fullness, early satiety, abdominal bloating.  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  NSAIDs: None  Antiplts/Anticoagulants/Anti thrombotics: None  GI Procedures: None  Past Medical History:  Diagnosis Date   Allergic rhinitis    Menstrual migraine    Obesity    Psoriasis     Past Surgical History:  Procedure Laterality Date   CHOLECYSTECTOMY      Current Outpatient Medications:    drospirenone-ethinyl estradiol (YAZ) 3-0.02 MG tablet, TAKE 1 TABLET BY MOUTH DAILY., Disp: 84 tablet, Rfl: 3    Family History  Problem Relation Age of Onset   Diabetes type II Father    Basal cell carcinoma Maternal Grandmother    Colon cancer Maternal Grandmother 47   Congestive Heart Failure Maternal Grandmother    Lung cancer Maternal Grandmother 65   Atrial fibrillation Maternal Grandmother    Alzheimer's disease Maternal Grandfather    Autoimmune disease Mother    Diabetes Paternal Grandmother    Psoriasis Maternal Aunt        with psoriatic arthritis   Melanoma Maternal Uncle        with brain mets   Breast cancer  Neg Hx      Social History   Tobacco Use   Smoking status: Former    Packs/day: 0.50    Years: 2.00    Pack years: 1.00    Types: Cigarettes    Quit date: 2000    Years since quitting: 22.8   Smokeless tobacco: Never  Vaping Use   Vaping Use: Never used  Substance Use Topics   Alcohol use: No   Drug use: No    Allergies as of 05/26/2021 - Review Complete 05/26/2021  Allergen Reaction Noted   Other Shortness Of Breath and Swelling 08/02/2018   Penicillins Hives and Rash 08/07/2013    Review of Systems:     All systems reviewed and negative except where noted in HPI.   Physical Exam:  BP (!) 145/84 (BP Location: Left Arm, Patient Position: Sitting, Cuff Size: Normal)   Pulse 92   Temp 99 F (37.2 C) (Oral)   Ht 5\' 7"  (1.702 m)   Wt 222 lb 3.2 oz (100.8 kg)   BMI 34.80 kg/m  No LMP recorded.  General:   Alert,  Well-developed, well-nourished, pleasant and cooperative in NAD Head:  Normocephalic and atraumatic. Eyes:  Sclera clear, no icterus.   Conjunctiva pink. Ears:  Normal auditory acuity. Nose:  No deformity, discharge, or lesions. Mouth:  No deformity or lesions,oropharynx pink & moist. Neck:  Supple; no masses or thyromegaly. Lungs:  Respirations even and unlabored.  Clear throughout to auscultation.   No wheezes, crackles, or rhonchi. No acute distress. Heart:  Regular rate and rhythm; no murmurs, clicks, rubs, or gallops. Abdomen:  Normal bowel sounds. Soft, distended without masses, hepatosplenomegaly or hernias noted.  No guarding or rebound tenderness.   Rectal: Not performed Msk:  Symmetrical without gross deformities. Good, equal movement & strength bilaterally. Pulses:  Normal pulses noted. Extremities:  No clubbing or edema.  No cyanosis. Neurologic:  Alert and oriented x3;  grossly normal neurologically. Skin:  Intact without significant lesions or rashes. No jaundice. Psych:  Alert and cooperative. Normal mood and affect.  Imaging Studies: Reviewed  Assessment and Plan:   Jenna Wright is a 40 y.o. pleasant Caucasian female with family history of rheumatoid arthritis, psoriasis, history of cholecystectomy, chronic nonbloody diarrhea, is seen in consultation for approximately 6 months history of upper abdominal pain associated with postprandial fullness, distention, early satiety with some weight loss.  Patient was empirically treated with prednisone for possible enteritis based on the CT scan and family history  Do not recommend steroids at this time Recommend  GI profile PCR, fecal calprotectin levels, pancreatic fecal elastase levels Recommend EGD and colonoscopy with TI evaluation, gastric, duodenal biopsies, terminal ileal biopsies, random colon biopsies   Follow up in 3 to 4 months   40, MD

## 2021-05-26 NOTE — Progress Notes (Signed)
Gave clinpiq sample for prep

## 2021-05-27 ENCOUNTER — Telehealth: Payer: Self-pay | Admitting: Gastroenterology

## 2021-05-30 ENCOUNTER — Encounter: Payer: Self-pay | Admitting: Gastroenterology

## 2021-05-30 ENCOUNTER — Other Ambulatory Visit: Payer: Self-pay

## 2021-05-30 ENCOUNTER — Other Ambulatory Visit: Payer: Self-pay | Admitting: Gastroenterology

## 2021-05-30 DIAGNOSIS — K580 Irritable bowel syndrome with diarrhea: Secondary | ICD-10-CM

## 2021-05-30 LAB — GI PROFILE, STOOL, PCR

## 2021-05-30 LAB — PANCREATIC ELASTASE, FECAL: Pancreatic Elastase, Fecal: 231 ug Elast./g (ref >200)

## 2021-05-30 LAB — CALPROTECTIN, FECAL: Calprotectin, Fecal: 20 ug/g (ref 0–120)

## 2021-05-30 MED ORDER — RIFAXIMIN 550 MG PO TABS
550.0000 mg | ORAL_TABLET | Freq: Three times a day (TID) | ORAL | 0 refills | Status: DC
  Filled 2021-05-30 – 2021-06-05 (×2): qty 42, 14d supply, fill #0

## 2021-06-05 ENCOUNTER — Other Ambulatory Visit: Payer: Self-pay

## 2021-06-06 ENCOUNTER — Other Ambulatory Visit: Payer: Self-pay

## 2021-06-16 NOTE — Telephone Encounter (Signed)
Opened in error

## 2021-06-19 ENCOUNTER — Ambulatory Visit: Payer: No Typology Code available for payment source | Admitting: Certified Registered"

## 2021-06-19 ENCOUNTER — Encounter: Payer: Self-pay | Admitting: Gastroenterology

## 2021-06-19 ENCOUNTER — Encounter
Admission: RE | Disposition: A | Discharge status: Home / Self Care | Payer: Self-pay | Source: Home / Self Care | Attending: Gastroenterology

## 2021-06-19 ENCOUNTER — Ambulatory Visit
Admission: RE | Admit: 2021-06-19 | Discharge: 2021-06-19 | Disposition: A | Discharge status: Home / Self Care | Payer: No Typology Code available for payment source | Attending: Gastroenterology | Admitting: Gastroenterology

## 2021-06-19 DIAGNOSIS — Z6834 Body mass index (BMI) 34.0-34.9, adult: Secondary | ICD-10-CM | POA: Insufficient documentation

## 2021-06-19 DIAGNOSIS — R1013 Epigastric pain: Secondary | ICD-10-CM

## 2021-06-19 DIAGNOSIS — Z87891 Personal history of nicotine dependence: Secondary | ICD-10-CM | POA: Diagnosis not present

## 2021-06-19 DIAGNOSIS — K2980 Duodenitis without bleeding: Secondary | ICD-10-CM | POA: Diagnosis not present

## 2021-06-19 DIAGNOSIS — K2970 Gastritis, unspecified, without bleeding: Secondary | ICD-10-CM | POA: Diagnosis not present

## 2021-06-19 DIAGNOSIS — K529 Noninfective gastroenteritis and colitis, unspecified: Secondary | ICD-10-CM | POA: Diagnosis not present

## 2021-06-19 DIAGNOSIS — E669 Obesity, unspecified: Secondary | ICD-10-CM | POA: Diagnosis not present

## 2021-06-19 DIAGNOSIS — R634 Abnormal weight loss: Secondary | ICD-10-CM | POA: Insufficient documentation

## 2021-06-19 HISTORY — PX: COLONOSCOPY WITH PROPOFOL: SHX5780

## 2021-06-19 HISTORY — PX: ESOPHAGOGASTRODUODENOSCOPY: SHX5428

## 2021-06-19 LAB — POCT PREGNANCY, URINE: Preg Test, Ur: NEGATIVE

## 2021-06-19 SURGERY — COLONOSCOPY WITH PROPOFOL
Anesthesia: General

## 2021-06-19 MED ORDER — PROPOFOL 10 MG/ML IV BOLUS
INTRAVENOUS | Status: DC | PRN
  Administered 2021-06-19 (×2): 100 mg via INTRAVENOUS

## 2021-06-19 MED ORDER — PROPOFOL 500 MG/50ML IV EMUL
INTRAVENOUS | Status: DC | PRN
  Administered 2021-06-19: 140 ug/kg/min via INTRAVENOUS

## 2021-06-19 MED ORDER — ACETAMINOPHEN 10 MG/ML IV SOLN
INTRAVENOUS | Status: AC
  Filled 2021-06-19: qty 100

## 2021-06-19 MED ORDER — ONDANSETRON HCL 4 MG/2ML IJ SOLN
INTRAMUSCULAR | Status: DC | PRN
  Administered 2021-06-19: 4 mg via INTRAVENOUS

## 2021-06-19 MED ORDER — GLYCOPYRROLATE 0.2 MG/ML IJ SOLN
INTRAMUSCULAR | Status: DC | PRN
  Administered 2021-06-19: .2 mg via INTRAVENOUS

## 2021-06-19 MED ORDER — ACETAMINOPHEN 10 MG/ML IV SOLN
INTRAVENOUS | Status: DC | PRN
  Administered 2021-06-19: 1000 mg via INTRAVENOUS

## 2021-06-19 MED ORDER — SODIUM CHLORIDE 0.9 % IV SOLN
INTRAVENOUS | Status: DC
  Administered 2021-06-19: 1000 mL via INTRAVENOUS

## 2021-06-19 MED ORDER — LIDOCAINE HCL (CARDIAC) PF 100 MG/5ML IV SOSY
PREFILLED_SYRINGE | INTRAVENOUS | Status: DC | PRN
  Administered 2021-06-19: 100 mg via INTRAVENOUS

## 2021-06-19 MED ORDER — DEXMEDETOMIDINE (PRECEDEX) IN NS 20 MCG/5ML (4 MCG/ML) IV SYRINGE
PREFILLED_SYRINGE | INTRAVENOUS | Status: DC | PRN
  Administered 2021-06-19: 12 ug via INTRAVENOUS

## 2021-06-19 NOTE — Anesthesia Preprocedure Evaluation (Signed)
Anesthesia Evaluation  Patient identified by MRN, date of birth, ID band Patient awake    Reviewed: Allergy & Precautions, NPO status , Patient's Chart, lab work & pertinent test results  History of Anesthesia Complications Negative for: history of anesthetic complications  Airway Mallampati: I   Neck ROM: Full    Dental no notable dental hx.    Pulmonary former smoker (quit 2000),    Pulmonary exam normal breath sounds clear to auscultation       Cardiovascular Exercise Tolerance: Good negative cardio ROS Normal cardiovascular exam Rhythm:Regular Rate:Normal  ECG 03/21/21: normal   Neuro/Psych  Headaches, Vertigo     GI/Hepatic negative GI ROS,   Endo/Other  Obesity   Renal/GU negative Renal ROS     Musculoskeletal   Abdominal   Peds  Hematology negative hematology ROS (+)   Anesthesia Other Findings   Reproductive/Obstetrics                             Anesthesia Physical Anesthesia Plan  ASA: 2  Anesthesia Plan: General   Post-op Pain Management:    Induction: Intravenous  PONV Risk Score and Plan: 3 and Propofol infusion, TIVA and Treatment may vary due to age or medical condition  Airway Management Planned: Natural Airway  Additional Equipment:   Intra-op Plan:   Post-operative Plan:   Informed Consent: I have reviewed the patients History and Physical, chart, labs and discussed the procedure including the risks, benefits and alternatives for the proposed anesthesia with the patient or authorized representative who has indicated his/her understanding and acceptance.       Plan Discussed with: CRNA  Anesthesia Plan Comments: (LMA/GETA backup discussed.  Patient consented for risks of anesthesia including but not limited to:  - adverse reactions to medications - damage to eyes, teeth, lips or other oral mucosa - nerve damage due to positioning  - sore throat or  hoarseness - damage to heart, brain, nerves, lungs, other parts of body or loss of life  Informed patient about role of CRNA in peri- and intra-operative care.  Patient voiced understanding.)        Anesthesia Quick Evaluation

## 2021-06-19 NOTE — Op Note (Signed)
[facility] Gastroenterology Patient Name: Jenna Wright Procedure Date: 06/19/2021 11:35 AM MRN: [id number] Account #: [id number] Date of Birth: 05/29/1981 Admit Type: Outpatient Age: 40 Room: [facility] ENDO ROOM 2 Gender: Female Note Status: Finalized Instrument Name: Upper Endoscope [id number] Procedure:             Upper GI endoscopy Indications:           Epigastric abdominal pain, Diarrhea, Weight loss Providers:             Toney Reil MD, MD Referring MD:          Marzella Schlein. Bacigalupo (Referring MD) Medicines:             General Anesthesia Complications:         No immediate complications. Estimated blood loss: None. Procedure:             Pre-Anesthesia Assessment:                        - Prior to the procedure, a History and Physical was                         performed, and patient medications and allergies were                         reviewed. The patient is competent. The risks and                         benefits of the procedure and the sedation options and                         risks were discussed with the patient. All questions                         were answered and informed consent was obtained.                         Patient identification and proposed procedure were                         verified by the physician, the nurse, the                         anesthesiologist, the anesthetist and the technician                         in the pre-procedure area in the procedure room in the                         endoscopy suite. Mental Status Examination: alert and                         oriented. Airway Examination: normal oropharyngeal                         airway and neck mobility. Respiratory Examination:                         clear to auscultation. CV Examination: normal.  Prophylactic Antibiotics: The patient does not require                         prophylactic antibiotics. Prior Anticoagulants: The                          patient has taken no previous anticoagulant or                         antiplatelet agents. ASA Grade Assessment: II - A                         patient with mild systemic disease. After reviewing                         the risks and benefits, the patient was deemed in                         satisfactory condition to undergo the procedure. The                         anesthesia plan was to use general anesthesia.                         Immediately prior to administration of medications,                         the patient was re-assessed for adequacy to receive                         sedatives. The heart rate, respiratory rate, oxygen                         saturations, blood pressure, adequacy of pulmonary                         ventilation, and response to care were monitored                         throughout the procedure. The physical status of the                         patient was re-assessed after the procedure.                        After obtaining informed consent, the endoscope was                         passed under direct vision. Throughout the procedure,                         the patient's blood pressure, pulse, and oxygen                         saturations were monitored continuously. The                         Endosonoscope was introduced through the mouth, and  advanced to the second part of duodenum. The upper GI                         endoscopy was accomplished without difficulty. The                         patient tolerated the procedure well. Findings:      The duodenal bulb and second portion of the duodenum were normal.       Biopsies for histology were taken with a cold forceps for evaluation of       celiac disease.      The entire examined stomach was normal. Biopsies were taken with a cold       forceps for Helicobacter pylori testing.      The cardia and gastric fundus were normal on retroflexion.       Esophagogastric landmarks were identified: the gastroesophageal junction       was found at 36 cm from the incisors.      The gastroesophageal junction and examined esophagus were normal. Impression:            - Normal duodenal bulb and second portion of the                         duodenum. Biopsied.                        - Normal stomach. Biopsied.                        - Esophagogastric landmarks identified.                        - Normal gastroesophageal junction and esophagus. Recommendation:        - Await pathology results.                        - Proceed with colonoscopy as scheduled                        See colonoscopy report Procedure Code(s):     --- Professional ---                        [phone], Esophagogastroduodenoscopy, flexible,                         transoral; with biopsy, single or multiple Diagnosis Code(s):     --- Professional ---                        R10.13, Epigastric pain                        R19.7, Diarrhea, unspecified                        R63.4, Abnormal weight loss CPT copyright 2019 American Medical Association. All rights reserved. The codes documented in this report are preliminary and upon coder review may  be revised to meet current compliance requirements. Dr. Ulyess Mort Lin Landsman MD, MD 06/19/2021 11:48:10 AM This report has been signed electronically. Number of Addenda: 0 Note Initiated On: 06/19/2021 11:35  AM Estimated Blood Loss:  Estimated blood loss: none.      [facility]

## 2021-06-19 NOTE — Transfer of Care (Signed)
Immediate Anesthesia Transfer of Care Note  Patient: Jenna Wright  Procedure(s) Performed: COLONOSCOPY WITH PROPOFOL ESOPHAGOGASTRODUODENOSCOPY (EGD)  Patient Location: PACU and Endoscopy Unit  Anesthesia Type:General  Level of Consciousness: drowsy  Airway & Oxygen Therapy: Patient Spontanous Breathing  Post-op Assessment: Report given to RN  Post vital signs: stable  Last Vitals:  Vitals Value Taken Time  BP    Temp    Pulse    Resp    SpO2      Last Pain:  Vitals:   06/19/21 1058  TempSrc: Temporal  PainSc: 6       Patients Stated Pain Goal: 0 (06/19/21 1058)  Complications: No notable events documented.

## 2021-06-19 NOTE — H&P (Signed)
Jenna Repress, MD [address]  [address]  [place], [place] [address]  Main: [phone]  Fax: [phone] Pager: [phone]  Primary Care Physician:  Erasmo Downer, MD Primary Gastroenterologist:  Dr. Arlyss Wright  Pre-Procedure History & Physical: HPI:  Jenna Wright is a 40 y.o. female is here for an endoscopy and colonoscopy.   Past Medical History:  Diagnosis Date   Allergic rhinitis    Menstrual migraine    Obesity    Psoriasis     Past Surgical History:  Procedure Laterality Date   CHOLECYSTECTOMY      Prior to Admission medications   Medication Sig Start Date End Date Taking? Authorizing Provider  drospirenone-ethinyl estradiol (YAZ) 3-0.02 MG tablet TAKE 1 TABLET BY MOUTH DAILY. 10/01/20 10/01/21 Yes Copland, Ilona Sorrel, PA-C  rifaximin (XIFAXAN) 550 MG TABS tablet Take 1 tablet (550 mg total) by mouth 3 (three) times daily for 14 days. 05/30/21 06/20/21 Yes Toney Reil, MD    Allergies as of 05/27/2021 - Review Complete 05/26/2021  Allergen Reaction Noted   Other Shortness Of Breath and Swelling 08/02/2018   Penicillins Hives and Rash 08/07/2013    Family History  Problem Relation Age of Onset   Diabetes type II Father    Basal cell carcinoma Maternal Grandmother    Colon cancer Maternal Grandmother 36   Congestive Heart Failure Maternal Grandmother    Lung cancer Maternal Grandmother 61   Atrial fibrillation Maternal Grandmother    Alzheimer's disease Maternal Grandfather    Autoimmune disease Mother    Diabetes Paternal Grandmother    Psoriasis Maternal Aunt        with psoriatic arthritis   Melanoma Maternal Uncle        with brain mets   Breast cancer Neg Hx     Social History   Socioeconomic History   Marital status: Single    Spouse name: Not on file   Number of children: 0   Years of education: Not on file   Highest education level: Not on file  Occupational History   Occupation: dietician  Tobacco Use    Smoking status: Former    Packs/day: 0.50    Years: 2.00    Pack years: 1.00    Types: Cigarettes    Quit date: 2000    Years since quitting: 22.9   Smokeless tobacco: Never  Vaping Use   Vaping Use: Never used  Substance and Sexual Activity   Alcohol use: No   Drug use: No   Sexual activity: Yes    Partners: Male    Birth control/protection: Pill  Other Topics Concern   Not on file  Social History Narrative   Not on file   Social Determinants of Health   Financial Resource Strain: Not on file  Food Insecurity: Not on file  Transportation Needs: Not on file  Physical Activity: Not on file  Stress: Not on file  Social Connections: Not on file  Intimate Partner Violence: Not on file    Review of Systems: See HPI, otherwise negative ROS  Physical Exam: BP 135/83   Pulse 78   Temp (!) 97 F (36.1 C) (Temporal)   Resp 18   Ht 5\' 7"  (1.702 m)   Wt 100 kg   LMP 06/05/2021 Comment: Pregnancy test negative.  SpO2 100%   BMI 34.53 kg/m  General:   Alert,  pleasant and cooperative in NAD Head:  Normocephalic and atraumatic. Neck:  Supple; no masses  or thyromegaly. Lungs:  Clear throughout to auscultation.    Heart:  Regular rate and rhythm. Abdomen:  Soft, nontender and nondistended. Normal bowel sounds, without guarding, and without rebound.   Neurologic:  Alert and  oriented x4;  grossly normal neurologically.  Impression/Plan: Jenna Wright is here for an endoscopy and colonoscopy to be performed for approximately 6 months history of upper abdominal pain associated with postprandial fullness, distention, early satiety with some weight loss.    Risks, benefits, limitations, and alternatives regarding  endoscopy and colonoscopy have been reviewed with the patient.  Questions have been answered.  All parties agreeable.   Lannette Donath, MD  06/19/2021, 11:09 AM

## 2021-06-19 NOTE — Op Note (Signed)
[facility] Gastroenterology Patient Name: Jenna Wright Procedure Date: 06/19/2021 11:34 AM MRN: [id number] Account #: [id number] Date of Birth: February 23, 1981 Admit Type: Outpatient Age: 40 Room: [facility] ENDO ROOM 2 Gender: Female Note Status: Finalized Instrument Name: Colonscope [id number] Procedure:             Colonoscopy Indications:           This is the patient's first colonoscopy, Chronic                         diarrhea, Weight loss Providers:             Lin Landsman MD, MD Referring MD:          Dionne Bucy. Bacigalupo (Referring MD) Medicines:             General Anesthesia Complications:         No immediate complications. Estimated blood loss: None. Procedure:             Pre-Anesthesia Assessment:                        - Prior to the procedure, a History and Physical was                         performed, and patient medications and allergies were                         reviewed. The patient is competent. The risks and                         benefits of the procedure and the sedation options and                         risks were discussed with the patient. All questions                         were answered and informed consent was obtained.                         Patient identification and proposed procedure were                         verified by the physician, the nurse, the                         anesthesiologist, the anesthetist and the technician                         in the pre-procedure area in the procedure room in the                         endoscopy suite. Mental Status Examination: alert and                         oriented. Airway Examination: normal oropharyngeal                         airway and neck mobility. Respiratory Examination:  clear to auscultation. CV Examination: normal.                         Prophylactic Antibiotics: The patient does not require                         prophylactic  antibiotics. Prior Anticoagulants: The                         patient has taken no previous anticoagulant or                         antiplatelet agents. ASA Grade Assessment: II - A                         patient with mild systemic disease. After reviewing                         the risks and benefits, the patient was deemed in                         satisfactory condition to undergo the procedure. The                         anesthesia plan was to use general anesthesia.                         Immediately prior to administration of medications,                         the patient was re-assessed for adequacy to receive                         sedatives. The heart rate, respiratory rate, oxygen                         saturations, blood pressure, adequacy of pulmonary                         ventilation, and response to care were monitored                         throughout the procedure. The physical status of the                         patient was re-assessed after the procedure.                        After obtaining informed consent, the colonoscope was                         passed under direct vision. Throughout the procedure,                         the patient's blood pressure, pulse, and oxygen                         saturations were monitored continuously. The  Colonoscope was introduced through the anus and                         advanced to the 10 cm into the ileum. The colonoscopy                         was performed without difficulty. The patient                         tolerated the procedure well. The quality of the bowel                         preparation was evaluated using the BBPS [facility] Bowel                         Preparation Scale) with scores of: Right Colon = 3,                         Transverse Colon = 3 and Left Colon = 3 (entire mucosa                         seen well with no residual staining, small fragments                          of stool or opaque liquid). The total BBPS score                         equals 9. Findings:      The perianal and digital rectal examinations were normal. Pertinent       negatives include normal sphincter tone and no palpable rectal lesions.      The terminal ileum appeared normal. Biopsies were taken with a cold       forceps for histology.      The colon (entire examined portion) appeared normal. Biopsies were taken       with a cold forceps for histology.      The retroflexed view of the distal rectum and anal verge was normal and       showed no anal or rectal abnormalities. Impression:            - The examined portion of the ileum was normal.                         Biopsied.                        - The entire examined colon is normal. Biopsied.                        - The distal rectum and anal verge are normal on                         retroflexion view. Recommendation:        - Discharge patient to home (with escort).                        - Resume regular diet today.                        -  Continue present medications.                        - Await pathology results. Procedure Code(s):     --- Professional ---                        [phone], Colonoscopy, flexible; with biopsy, single or                         multiple Diagnosis Code(s):     --- Professional ---                        K52.9, Noninfective gastroenteritis and colitis,                         unspecified                        R63.4, Abnormal weight loss CPT copyright 2019 American Medical Association. All rights reserved. The codes documented in this report are preliminary and upon coder review may  be revised to meet current compliance requirements. Dr. Libby Maw Toney Reil MD, MD 06/19/2021 12:10:16 PM This report has been signed electronically. Number of Addenda: 0 Note Initiated On: 06/19/2021 11:34 AM Scope Withdrawal Time: 0 hours 12 minutes 25 seconds  Total Procedure  Duration: 0 hours 16 minutes 53 seconds  Estimated Blood Loss:  Estimated blood loss: none.      [facility]

## 2021-06-19 NOTE — Anesthesia Postprocedure Evaluation (Signed)
Anesthesia Post Note  Patient: Jenna Wright  Procedure(s) Performed: COLONOSCOPY WITH PROPOFOL ESOPHAGOGASTRODUODENOSCOPY (EGD)  Patient location during evaluation: PACU Anesthesia Type: General Level of consciousness: awake and alert, oriented and patient cooperative Pain management: pain level controlled Vital Signs Assessment: post-procedure vital signs reviewed and stable Respiratory status: spontaneous breathing, nonlabored ventilation and respiratory function stable Cardiovascular status: blood pressure returned to baseline and stable Postop Assessment: adequate PO intake Anesthetic complications: no   No notable events documented.   Last Vitals:  Vitals:   06/19/21 1223 06/19/21 1233  BP: 109/73 125/80  Pulse: 80 69  Resp:  18  Temp:    SpO2: 96% 97%    Last Pain:  Vitals:   06/19/21 1233  TempSrc:   PainSc: 0-No pain                 Reed Breech

## 2021-06-20 ENCOUNTER — Encounter: Payer: Self-pay | Admitting: Gastroenterology

## 2021-06-20 LAB — SURGICAL PATHOLOGY

## 2021-06-23 ENCOUNTER — Other Ambulatory Visit: Payer: Self-pay

## 2021-06-23 ENCOUNTER — Other Ambulatory Visit: Payer: Self-pay | Admitting: Gastroenterology

## 2021-06-23 ENCOUNTER — Encounter: Payer: Self-pay | Admitting: Gastroenterology

## 2021-06-23 DIAGNOSIS — K580 Irritable bowel syndrome with diarrhea: Secondary | ICD-10-CM

## 2021-06-23 MED ORDER — RIFAXIMIN 550 MG PO TABS
550.0000 mg | ORAL_TABLET | Freq: Three times a day (TID) | ORAL | 0 refills | Status: AC
  Filled 2021-06-23 – 2021-06-25 (×3): qty 42, 14d supply, fill #0

## 2021-06-24 ENCOUNTER — Other Ambulatory Visit: Payer: Self-pay

## 2021-06-24 ENCOUNTER — Ambulatory Visit (INDEPENDENT_AMBULATORY_CARE_PROVIDER_SITE_OTHER): Payer: No Typology Code available for payment source | Admitting: Obstetrics and Gynecology

## 2021-06-24 ENCOUNTER — Encounter: Payer: Self-pay | Admitting: Obstetrics and Gynecology

## 2021-06-24 VITALS — BP 130/80 | Ht 67.0 in | Wt 223.0 lb

## 2021-06-24 DIAGNOSIS — N644 Mastodynia: Secondary | ICD-10-CM | POA: Diagnosis not present

## 2021-06-24 NOTE — Progress Notes (Signed)
Virginia Crews, MD   Chief Complaint  Patient presents with   Breast Exam    Soreness RB only x 1 week    HPI:      [name] is a 40 y.o. G0P0000 whose LMP was Patient's last menstrual period was 06/05/2021 (exact date)., presents today for RT breast pain for a wk, sx improved now. Was tender to touch. No masses although felt lumpy in general. No erythema, trauma, nipple d/c. Had similar sx for a wk after neg mammo 5/22, and again now. Drinks 1-2 caffeinated drinks daily.  Her menses are absent now with OCPs, or has occas spotting for a few days on placebo pills. Has menstrual migraines 1 wk before menses and breast pain sx this time occurred with headaches, 1 wk before menses.  Neg mammo 11/21/20 No FH breast/ovar ca.  Patient Active Problem List   Diagnosis Date Noted   Abdominal pain, epigastric    Chronic diarrhea    Psoriasis    Allergic rhinitis     Past Surgical History:  Procedure Laterality Date   CHOLECYSTECTOMY     COLONOSCOPY WITH PROPOFOL N/A 06/19/2021   Procedure: COLONOSCOPY WITH PROPOFOL;  Surgeon: Lin Landsman, MD;  Location: [facility] ENDOSCOPY;  Service: Gastroenterology;  Laterality: N/A;   ESOPHAGOGASTRODUODENOSCOPY N/A 06/19/2021   Procedure: ESOPHAGOGASTRODUODENOSCOPY (EGD);  Surgeon: Lin Landsman, MD;  Location: [facility] ENDOSCOPY;  Service: Gastroenterology;  Laterality: N/A;    Family History  Problem Relation Age of Onset   Diabetes type II Father    Basal cell carcinoma Maternal Grandmother    Colon cancer Maternal Grandmother 18   Congestive Heart Failure Maternal Grandmother    Lung cancer Maternal Grandmother 80   Atrial fibrillation Maternal Grandmother    Alzheimer's disease Maternal Grandfather    Autoimmune disease Mother    Diabetes Paternal Grandmother    Psoriasis Maternal Aunt        with psoriatic arthritis   Melanoma Maternal Uncle        with brain mets   Breast cancer Neg Hx     Social History    Socioeconomic History   Marital status: Single    Spouse name: Not on file   Number of children: 0   Years of education: Not on file   Highest education level: Not on file  Occupational History   Occupation: dietician  Tobacco Use   Smoking status: Former    Packs/day: 0.50    Years: 2.00    Pack years: 1.00    Types: Cigarettes    Quit date: 2000    Years since quitting: 22.9   Smokeless tobacco: Never  Vaping Use   Vaping Use: Never used  Substance and Sexual Activity   Alcohol use: No   Drug use: No   Sexual activity: Yes    Partners: Male    Birth control/protection: Pill  Other Topics Concern   Not on file  Social History Narrative   Not on file   Social Determinants of Health   Financial Resource Strain: Not on file  Food Insecurity: Not on file  Transportation Needs: Not on file  Physical Activity: Not on file  Stress: Not on file  Social Connections: Not on file  Intimate Partner Violence: Not on file    Outpatient Medications Prior to Visit  Medication Sig Dispense Refill   drospirenone-ethinyl estradiol (YAZ) 3-0.02 MG tablet TAKE 1 TABLET BY MOUTH DAILY. 84 tablet 3   rifaximin (  XIFAXAN) 550 MG TABS tablet Take 1 tablet (550 mg total) by mouth 3 (three) times daily for 14 days. 42 tablet 0   No facility-administered medications prior to visit.      ROS:  Review of Systems  Constitutional:  Negative for fever.  Gastrointestinal:  Negative for blood in stool, constipation, diarrhea, nausea and vomiting.  Genitourinary:  Negative for dyspareunia, dysuria, flank pain, frequency, hematuria, urgency, vaginal bleeding, vaginal discharge and vaginal pain.  Musculoskeletal:  Negative for back pain.  Skin:  Negative for rash.  BREAST: pain   OBJECTIVE:   Vitals:  BP 130/80   Ht 5\' 7"  (1.702 m)   Wt 223 lb (101.2 kg)   LMP 06/05/2021 (Exact Date)   BMI 34.93 kg/m   Physical Exam Vitals reviewed.  Pulmonary:     Effort: Pulmonary effort is  normal.  Chest:  Breasts:    Breasts are symmetrical.     Right: No inverted nipple, mass, nipple discharge, skin change or tenderness.     Left: No inverted nipple, mass, nipple discharge, skin change or tenderness.  Musculoskeletal:        General: Normal range of motion.     Cervical back: Normal range of motion.  Skin:    General: Skin is warm and dry.  Neurological:     General: No focal deficit present.     Mental Status: She is alert and oriented to person, place, and time.     Cranial Nerves: No cranial nerve deficit.  Psychiatric:        Mood and Affect: Mood normal.        Behavior: Behavior normal.        Thought Content: Thought content normal.        Judgment: Judgment normal.    Assessment/Plan: Breast pain--RT breast for a wk, before menses. Sx improved. Neg breast exam. Most likely hormonal. D/c caffeine. F/u if sx recur for breast u/s and mammo.     Return if symptoms worsen or fail to improve.  Hudson Majkowski B. Aisha Greenberger, PA-C 06/24/2021 9:12 AM

## 2021-06-25 ENCOUNTER — Telehealth: Payer: Self-pay

## 2021-06-25 ENCOUNTER — Other Ambulatory Visit: Payer: Self-pay

## 2021-06-25 NOTE — Telephone Encounter (Signed)
The request has been approved. The authorization is effective for a maximum of 1 fills from 06/25/2021 to 09/17/2021, called patient as well to let her know she has been approved

## 2021-07-21 MED FILL — Drospirenone-Ethinyl Estradiol Tab 3-0.02 MG: ORAL | 84 days supply | Qty: 84 | Fill #3 | Status: AC

## 2021-07-22 ENCOUNTER — Other Ambulatory Visit: Payer: Self-pay

## 2021-10-01 NOTE — Progress Notes (Signed)
? ?PCP:  Virginia Crews, MD ? ? ?Chief Complaint  ?Patient presents with  ? Gynecologic Exam  ?  No concerns  ? ? ? ?HPI: ?     Jenna Wright is a 41 y.o. G0P0000 who LMP was Patient's last menstrual period was 09/18/2021 (approximate)., presents today for her annual examination.  Her menses are absent now with OCPs, or has occas spotting for a few days on placebo pills  Dysmenorrhea none. She does not have intermenstrual bleeding. Has menstrual migraines 1 wk before menses, but less severe when changed to yaz with better sx control.  ? ?Sex activity: single partner, contraception - OCP (estrogen/progesterone).  ?Last Pap: 09/21/17  Results were: no abnormalities /neg HPV DNA  ?Hx of STDs: none ? ?Mammogram: 11/21/20 Results were normal, repeat in 12 months ?There is no FH of breast cancer. There is no FH of ovarian cancer. The patient does do occas self-breast exams. Has RT breast pain intermittently since last mammo. Had neg exam with me 12/22. Can't correlate with cycle. Drinks some caffeine. No masses.  ? ?Tobacco use: The patient denies current or previous tobacco use. ?Alcohol use: none ?No drug use.  ?Exercise: very active ? ?Colonoscopy/EGD 12/22 with [facility] ? ?She does get adequate calcium and Vitamin D in her diet. ?Labs with PCP.  ? ?Past Medical History:  ?Diagnosis Date  ? Allergic rhinitis   ? Menstrual migraine   ? Obesity   ? Psoriasis   ? ? ?Past Surgical History:  ?Procedure Laterality Date  ? CHOLECYSTECTOMY    ? COLONOSCOPY WITH PROPOFOL N/A 06/19/2021  ? Procedure: COLONOSCOPY WITH PROPOFOL;  Surgeon: Lin Landsman, MD;  Location: [facility] ENDOSCOPY;  Service: Gastroenterology;  Laterality: N/A;  ? ESOPHAGOGASTRODUODENOSCOPY N/A 06/19/2021  ? Procedure: ESOPHAGOGASTRODUODENOSCOPY (EGD);  Surgeon: Lin Landsman, MD;  Location: [facility] ENDOSCOPY;  Service: Gastroenterology;  Laterality: N/A;  ? ? ?Family History  ?Problem Relation Age of Onset  ? Diabetes type II Father   ? Basal  cell carcinoma Maternal Grandmother   ? Colon cancer Maternal Grandmother 32  ? Congestive Heart Failure Maternal Grandmother   ? Lung cancer Maternal Grandmother 45  ? Atrial fibrillation Maternal Grandmother   ? Alzheimer's disease Maternal Grandfather   ? Autoimmune disease Mother   ? Diabetes Paternal Grandmother   ? Psoriasis Maternal Aunt   ?     with psoriatic arthritis  ? Melanoma Maternal Uncle   ?     with brain mets  ? Breast cancer Neg Hx   ? ? ?Social History  ? ?Socioeconomic History  ? Marital status: Single  ?  Spouse name: Not on file  ? Number of children: 0  ? Years of education: Not on file  ? Highest education level: Not on file  ?Occupational History  ? Occupation: dietician  ?Tobacco Use  ? Smoking status: Former  ?  Packs/day: 0.50  ?  Years: 2.00  ?  Pack years: 1.00  ?  Types: Cigarettes  ?  Quit date: 2000  ?  Years since quitting: 23.2  ? Smokeless tobacco: Never  ?Vaping Use  ? Vaping Use: Never used  ?Substance and Sexual Activity  ? Alcohol use: No  ? Drug use: No  ? Sexual activity: Yes  ?  Partners: Male  ?  Birth control/protection: Pill  ?Other Topics Concern  ? Not on file  ?Social History Narrative  ? Not on file  ? ?Social Determinants of Health  ? ?  Financial Resource Strain: Not on file  ?Food Insecurity: Not on file  ?Transportation Needs: Not on file  ?Physical Activity: Not on file  ?Stress: Not on file  ?Social Connections: Not on file  ?Intimate Partner Violence: Not on file  ? ? ?Current Meds  ?Medication Sig  ? [DISCONTINUED] drospirenone-ethinyl estradiol (YAZ) 3-0.02 MG tablet TAKE 1 TABLET BY MOUTH DAILY.  ? ? ? ?ROS: ? ?Review of Systems  ?Constitutional:  Negative for fatigue, fever and unexpected weight change.  ?Respiratory:  Negative for cough, shortness of breath and wheezing.   ?Cardiovascular:  Negative for chest pain, palpitations and leg swelling.  ?Gastrointestinal:  Negative for blood in stool, constipation, diarrhea, nausea and vomiting.  ?Endocrine:  Negative for cold intolerance, heat intolerance and polyuria.  ?Genitourinary:  Negative for dyspareunia, dysuria, flank pain, frequency, genital sores, hematuria, menstrual problem, pelvic pain, urgency, vaginal bleeding, vaginal discharge and vaginal pain.  ?Musculoskeletal:  Negative for back pain, joint swelling and myalgias.  ?Skin:  Negative for rash.  ?Neurological:  Negative for dizziness, syncope, light-headedness, numbness and headaches.  ?Hematological:  Negative for adenopathy.  ?Psychiatric/Behavioral:  Negative for agitation, confusion, sleep disturbance and suicidal ideas. The patient is not nervous/anxious.   ? ? ?Objective: ?BP 100/60   Ht 5\' 7"  (1.702 m)   Wt 234 lb (106.1 kg)   LMP 09/18/2021 (Approximate)   BMI 36.65 kg/m?  ? ? ?Physical Exam ?Constitutional:   ?   Appearance: She is well-developed.  ?Genitourinary:  ?   Vulva normal.  ?   Right Labia: No rash, tenderness or lesions. ?   Left Labia: No tenderness, lesions or rash. ?   No vaginal discharge, erythema or tenderness.  ? ?   Right Adnexa: not tender and no mass present. ?   Left Adnexa: not tender and no mass present. ?   No cervical motion tenderness, friability or polyp.  ?   Uterus is not enlarged or tender.  ?Breasts: ?   Right: No mass, nipple discharge, skin change or tenderness.  ?   Left: No mass, nipple discharge, skin change or tenderness.  ?Neck:  ?   Thyroid: No thyromegaly.  ?Cardiovascular:  ?   Rate and Rhythm: Normal rate and regular rhythm.  ?   Heart sounds: Normal heart sounds. No murmur heard. ?Pulmonary:  ?   Effort: Pulmonary effort is normal.  ?   Breath sounds: Normal breath sounds.  ?Abdominal:  ?   Palpations: Abdomen is soft.  ?   Tenderness: There is no abdominal tenderness. There is no guarding or rebound.  ?Musculoskeletal:     ?   General: Normal range of motion.  ?   Cervical back: Normal range of motion.  ?Lymphadenopathy:  ?   Cervical: No cervical adenopathy.  ?Neurological:  ?   General: No  focal deficit present.  ?   Mental Status: She is alert and oriented to person, place, and time.  ?   Cranial Nerves: No cranial nerve deficit.  ?Skin: ?   General: Skin is warm and dry.  ?Psychiatric:     ?   Mood and Affect: Mood normal.     ?   Behavior: Behavior normal.     ?   Thought Content: Thought content normal.     ?   Judgment: Judgment normal.  ?Vitals reviewed.  ? ? ?Assessment/Plan: ?Encounter for annual routine gynecological examination ? ?Cervical cancer screening - Plan: Cytology - PAP ? ?Screening for HPV (human papillomavirus) -  Plan: Cytology - PAP ? ?Encounter for screening mammogram for malignant neoplasm of breast - Plan: MM 3D SCREEN BREAST BILATERAL; pt to schedule mammo ? ?Encounter for surveillance of contraceptive pills - Plan: drospirenone-ethinyl estradiol (YAZ) 3-0.02 MG tablet; OCP RF ? ?Breast pain--RT breast, intermittent. D/C caffeine, add Vit E 400 IU BID. F/u prn.  ? ? ?Meds ordered this encounter  ?Medications  ? drospirenone-ethinyl estradiol (YAZ) 3-0.02 MG tablet  ?  Sig: TAKE 1 TABLET BY MOUTH DAILY.  ?  Dispense:  84 tablet  ?  Refill:  3  ?  Order Specific Question:   Supervising Provider  ?  AnswerGae Dry [id number]  ?          ?GYN counsel adequate intake of calcium and vitamin D ? ? ?  F/U ? Return in about 1 year (around 10/03/2022). ? ?Lizbet Cirrincione B. Deem Marmol, PA-C ?10/02/2021 ?9:12 AM ?

## 2021-10-02 ENCOUNTER — Other Ambulatory Visit: Payer: Self-pay

## 2021-10-02 ENCOUNTER — Encounter: Payer: Self-pay | Admitting: Obstetrics and Gynecology

## 2021-10-02 ENCOUNTER — Other Ambulatory Visit (HOSPITAL_COMMUNITY)
Admission: RE | Admit: 2021-10-02 | Discharge: 2021-10-02 | Disposition: A | Discharge status: Home / Self Care | Payer: No Typology Code available for payment source | Source: Ambulatory Visit | Attending: Obstetrics and Gynecology | Admitting: Obstetrics and Gynecology

## 2021-10-02 ENCOUNTER — Ambulatory Visit (INDEPENDENT_AMBULATORY_CARE_PROVIDER_SITE_OTHER): Payer: No Typology Code available for payment source | Admitting: Obstetrics and Gynecology

## 2021-10-02 VITALS — BP 100/60 | Ht 67.0 in | Wt 234.0 lb

## 2021-10-02 DIAGNOSIS — Z1231 Encounter for screening mammogram for malignant neoplasm of breast: Secondary | ICD-10-CM | POA: Diagnosis not present

## 2021-10-02 DIAGNOSIS — Z1151 Encounter for screening for human papillomavirus (HPV): Secondary | ICD-10-CM | POA: Insufficient documentation

## 2021-10-02 DIAGNOSIS — Z124 Encounter for screening for malignant neoplasm of cervix: Secondary | ICD-10-CM | POA: Diagnosis not present

## 2021-10-02 DIAGNOSIS — Z3041 Encounter for surveillance of contraceptive pills: Secondary | ICD-10-CM

## 2021-10-02 DIAGNOSIS — Z01419 Encounter for gynecological examination (general) (routine) without abnormal findings: Secondary | ICD-10-CM | POA: Diagnosis not present

## 2021-10-02 DIAGNOSIS — N644 Mastodynia: Secondary | ICD-10-CM

## 2021-10-02 MED ORDER — DROSPIRENONE-ETHINYL ESTRADIOL 3-0.02 MG PO TABS
1.0000 | ORAL_TABLET | Freq: Every day | ORAL | 3 refills | Status: DC
  Filled 2021-10-02: qty 84, 84d supply, fill #0
  Filled 2022-01-01: qty 84, 84d supply, fill #1

## 2021-10-02 NOTE — Patient Instructions (Signed)
I value your feedback and you entrusting us with your care. If you get a [facility] patient survey, I would appreciate you taking the time to let us know about your experience today. Thank you!  [facility]: [phone]      

## 2021-10-06 LAB — CYTOLOGY - PAP
Comment: NEGATIVE
Diagnosis: NEGATIVE
High risk HPV: NEGATIVE

## 2021-10-15 NOTE — Progress Notes (Signed)
? ?I,Joseline E Rosas,acting as a scribe for Jacky Kindle, FNP.,have documented all relevant documentation on the behalf of Jacky Kindle, FNP,as directed by  Jacky Kindle, FNP while in the presence of Jacky Kindle, FNP.  ? ? ?Complete physical exam ? ? ?Patient: Jenna Wright   DOB: 16-Oct-1980   41 y.o. Female  MRN: [id number] ?Visit Date: 10/16/2021 ? ?Today's healthcare provider: Jacky Kindle, FNP  ? ?Introduced to Publishing rights manager role and practice setting.  All questions answered.  Discussed provider/patient relationship and expectations. ? ?Chief Complaint  ?Patient presents with  ? Annual Exam  ? ?Subjective  ?  ?Jenna Wright is a 41 y.o. female who presents today for a complete physical exam.  ?She reports consuming a general diet. Home exercise routine includes treadmill and stepper, cardio and strength. She generally feels well. She reports sleeping well. She does not have additional problems to discuss today.  ? 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  ? ?Pt would like Vit D checked; her OB PA recommended she take Vit D supplement however, pt has never had this lab checked and does not know if she is deficient. ? ? ?HPI  ?Pap:10/02/2021-Normal,HPV-Negative ?Mammogram Scheduled 11/25/2021 ? ?Past Medical History:  ?Diagnosis Date  ? Allergic rhinitis   ? Menstrual migraine   ? Obesity   ? Psoriasis   ? ?Past Surgical History:  ?Procedure Laterality Date  ? CHOLECYSTECTOMY    ? COLONOSCOPY WITH PROPOFOL N/A 06/19/2021  ? Procedure: COLONOSCOPY WITH PROPOFOL;  Surgeon: Toney Reil, MD;  Location[facility] ENDOSCOPY;  Service: Gastroenterology;  Laterality: N/A;  ? ESOPHAGOGASTRODUODENOSCOPY N/A 06/19/2021  ? Procedure: ESOPHAGOGASTRODUODENOSCOPY (EGD);  Surgeon: Toney Reil, MD;  Location: [facility] ENDOSCOPY;  Service: Gastroenterology;  Laterality: N/A;  ? ?Social  History  ? ?Socioeconomic History  ? Marital status: Single  ?  Spouse name: Not on file  ? Number of children: 0  ? Years of education: Not on file  ? Highest education level: Not on file  ?Occupational History  ? Occupation: dietician  ?Tobacco Use  ? Smoking status: Former  ?  Packs/day: 0.50  ?  Years: 2.00  ?  Pack years: 1.00  ?  Types: Cigarettes  ?  Quit date: 2000  ?  Years since quitting: 23.2  ? Smokeless tobacco: Never  ?Vaping Use  ? Vaping Use: Never used  ?Substance and Sexual Activity  ? Alcohol use: No  ? Drug use: No  ? Sexual activity: Yes  ?  Partners: Male  ?  Birth control/protection: Pill  ?Other Topics Concern  ? Not on file  ?Social History Narrative  ? Not on file  ? ?Social Determinants of Health  ? ?Financial Resource Strain: Not on file  ?Food Insecurity: Not on file  ?Transportation Needs: Not on file  ?Physical Activity: Not on file  ?Stress: Not on file  ?Social Connections: Not on file  ?Intimate Partner Violence: Not on file  ? ?Family Status  ?Relation Name Status  ? Father  Alive  ? MGM  Alive  ? MGF  Deceased  ? Mother  Alive  ? PGM  Deceased  ? PGF  Deceased  ? Mat Aunt  Alive  ? Mat Uncle  Deceased  ? Neg Hx  (Not Specified)  ? ?Family History  ?Problem Relation Age  of Onset  ? Diabetes type II Father   ? Basal cell carcinoma Maternal Grandmother   ? Colon cancer Maternal Grandmother 60  ? Congestive Heart Failure Maternal Grandmother   ? Lung cancer Maternal Grandmother 36  ? Atrial fibrillation Maternal Grandmother   ? Alzheimer's disease Maternal Grandfather   ? Autoimmune disease Mother   ? Diabetes Paternal Grandmother   ? Psoriasis Maternal Aunt   ?     with psoriatic arthritis  ? Melanoma Maternal Uncle   ?     with brain mets  ? Breast cancer Neg Hx   ? ?Allergies  ?Allergen Reactions  ? Other Shortness Of Breath and Swelling  ?  Bee stings  ? Penicillins Hives and Rash  ?  ?Patient Care Team: ?Erasmo Downer, MD as PCP - General (Family Medicine)   ? ?Medications: ?Outpatient Medications Prior to Visit  ?Medication Sig  ? drospirenone-ethinyl estradiol (YAZ) 3-0.02 MG tablet TAKE 1 TABLET BY MOUTH DAILY.  ? ?No facility-administered medications prior to visit.  ? ? ?Review of Systems  ?Constitutional: Negative.   ?HENT: Negative.    ?Eyes: Negative.   ?Respiratory: Negative.    ?Cardiovascular: Negative.   ?Gastrointestinal: Negative.   ?Endocrine: Negative.   ?Genitourinary: Negative.   ?Musculoskeletal: Negative.   ?Skin: Negative.   ?Allergic/Immunologic: Negative.   ?Neurological: Negative.   ?Hematological: Negative.   ?Psychiatric/Behavioral: Negative.    ? ? ? Objective  ?  ?BP 128/77 (BP Location: Left Arm, Patient Position: Sitting, Cuff Size: Large)   Pulse 79   Temp 98.3 ?F (36.8 ?C) (Oral)   Resp 16   Ht 5\' 7"  (1.702 m)   Wt 229 lb 1.6 oz (103.9 kg)   LMP 09/18/2021 (Approximate)   BMI 35.88 kg/m?  ? ? ? ?Physical Exam ?Vitals and nursing note reviewed.  ?Constitutional:   ?   General: She is awake. She is not in acute distress. ?   Appearance: Normal appearance. She is well-developed and well-groomed. She is obese. She is not ill-appearing, toxic-appearing or diaphoretic.  ?HENT:  ?   Head: Normocephalic and atraumatic.  ?   Jaw: There is normal jaw occlusion. No trismus, tenderness, swelling or pain on movement.  ?   Right Ear: Hearing, tympanic membrane, ear canal and external ear normal. There is no impacted cerumen.  ?   Left Ear: Hearing, tympanic membrane, ear canal and external ear normal. There is no impacted cerumen.  ?   Nose: Nose normal. No congestion or rhinorrhea.  ?   Right Turbinates: Not enlarged, swollen or pale.  ?   Left Turbinates: Not enlarged, swollen or pale.  ?   Right Sinus: No maxillary sinus tenderness or frontal sinus tenderness.  ?   Left Sinus: No maxillary sinus tenderness or frontal sinus tenderness.  ?   Mouth/Throat:  ?   Lips: Pink.  ?   Mouth: Mucous membranes are moist. No injury.  ?   Tongue: No  lesions.  ?   Pharynx: Oropharynx is clear. Uvula midline. No pharyngeal swelling, oropharyngeal exudate, posterior oropharyngeal erythema or uvula swelling.  ?   Tonsils: No tonsillar exudate or tonsillar abscesses.  ?Eyes:  ?   General: Lids are normal. Lids are everted, no foreign bodies appreciated. Vision grossly intact. Gaze aligned appropriately. No allergic shiner or visual field deficit.    ?   Right eye: No discharge.     ?   Left eye: No discharge.  ?   Extraocular  Movements: Extraocular movements intact.  ?   Conjunctiva/sclera: Conjunctivae normal.  ?   Right eye: Right conjunctiva is not injected. No exudate. ?   Left eye: Left conjunctiva is not injected. No exudate. ?   Pupils: Pupils are equal, round, and reactive to light.  ?Neck:  ?   Thyroid: No thyroid mass, thyromegaly or thyroid tenderness.  ?   Vascular: No carotid bruit.  ?   Trachea: Trachea normal.  ?Cardiovascular:  ?   Rate and Rhythm: Normal rate and regular rhythm.  ?   Pulses: Normal pulses.     ?     Carotid pulses are 2+ on the right side and 2+ on the left side. ?     Radial pulses are 2+ on the right side and 2+ on the left side.  ?     Dorsalis pedis pulses are 2+ on the right side and 2+ on the left side.  ?     Posterior tibial pulses are 2+ on the right side and 2+ on the left side.  ?   Heart sounds: Normal heart sounds, S1 normal and S2 normal. No murmur heard. ?  No friction rub. No gallop.  ?Pulmonary:  ?   Effort: Pulmonary effort is normal. No respiratory distress.  ?   Breath sounds: Normal breath sounds and air entry. No stridor. No wheezing, rhonchi or rales.  ?Chest:  ?   Chest wall: No tenderness.  ?Abdominal:  ?   General: Abdomen is flat. Bowel sounds are normal. There is no distension.  ?   Palpations: Abdomen is soft. There is no mass.  ?   Tenderness: There is no abdominal tenderness. There is no right CVA tenderness, left CVA tenderness, guarding or rebound.  ?   Hernia: No hernia is present.   ?Genitourinary: ?   Comments: Exam deferred; denies complaints ?Musculoskeletal:     ?   General: No swelling, tenderness, deformity or signs of injury. Normal range of motion.  ?   Cervical back: Full passive range of motion without pain, normal ran

## 2021-10-16 ENCOUNTER — Ambulatory Visit (INDEPENDENT_AMBULATORY_CARE_PROVIDER_SITE_OTHER): Payer: No Typology Code available for payment source | Admitting: Family Medicine

## 2021-10-16 ENCOUNTER — Encounter: Payer: Self-pay | Admitting: Family Medicine

## 2021-10-16 ENCOUNTER — Encounter: Payer: No Typology Code available for payment source | Admitting: Family Medicine

## 2021-10-16 VITALS — BP 128/77 | HR 79 | Temp 98.3°F | Resp 16 | Ht 67.0 in | Wt 229.1 lb

## 2021-10-16 DIAGNOSIS — Z0000 Encounter for general adult medical examination without abnormal findings: Secondary | ICD-10-CM | POA: Diagnosis not present

## 2021-10-16 DIAGNOSIS — Z1159 Encounter for screening for other viral diseases: Secondary | ICD-10-CM | POA: Diagnosis not present

## 2021-10-16 DIAGNOSIS — Z1321 Encounter for screening for nutritional disorder: Secondary | ICD-10-CM

## 2021-10-16 DIAGNOSIS — Z203 Contact with and (suspected) exposure to rabies: Secondary | ICD-10-CM

## 2021-10-16 NOTE — Assessment & Plan Note (Signed)
Low risk screen ?Treatable, and curable. If left untreated Hep C can lead to cirrhosis and liver failure. ?Encourage routine testing; recommend testing if risk factors change. ? ?

## 2021-10-16 NOTE — Assessment & Plan Note (Signed)
Works as Museum/gallery conservator ?Hx of bites from rabid animals, prior to rabies vaccinations ?Would like to repeat blood work to ensure she is covered for PT employment  ?

## 2021-10-16 NOTE — Assessment & Plan Note (Signed)
Was told by GYN PA to take Vit D supplement ?Pt would like to know if she has deficiency ?Has not had this lab tested prior  ?

## 2021-10-16 NOTE — Assessment & Plan Note (Signed)
UTD on dental ?Has never had eyes checked; encouraged screening every 2 years without use of corrective lens ?Things to do to keep yourself healthy  ?- Exercise at least 30-45 minutes a day, 3-4 days a week.  ?- Eat a low-fat diet with lots of fruits and vegetables, up to 7-9 servings per day.  ?- Seatbelts can save your life. Wear them always.  ?- Smoke detectors on every level of your home, check batteries every year.  ?- Eye Doctor - have an eye exam every 1-2 years  ?- Safe sex - if you may be exposed to STDs, use a condom.  ?- Alcohol -  If you drink, do it moderately, less than 2 drinks per day.  ?- Health Care Power of [place]. Choose someone to speak for you if you are not able.  ?- Depression is common in our stressful world.If you're feeling down or losing interest in things you normally enjoy, please come in for a visit.  ?- Violence - If anyone is threatening or hurting you, please call immediately. ? ? ?

## 2021-10-22 ENCOUNTER — Encounter: Payer: Self-pay | Admitting: Gastroenterology

## 2021-10-31 LAB — COMPREHENSIVE METABOLIC PANEL
ALT: 31 IU/L (ref 0–32)
AST: 21 IU/L (ref 0–40)
Albumin/Globulin Ratio: 1.5 (ref 1.2–2.2)
Albumin: 4 g/dL (ref 3.8–4.8)
Alkaline Phosphatase: 76 IU/L (ref 44–121)
BUN/Creatinine Ratio: 15 (ref 9–23)
BUN: 13 mg/dL (ref 6–24)
Bilirubin Total: 0.2 mg/dL (ref 0.0–1.2)
CO2: 22 mmol/L (ref 20–29)
Calcium: 9 mg/dL (ref 8.7–10.2)
Chloride: 107 mmol/L — ABNORMAL HIGH (ref 96–106)
Creatinine, Ser: 0.87 mg/dL (ref 0.57–1.00)
Globulin, Total: 2.6 g/dL (ref 1.5–4.5)
Glucose: 88 mg/dL (ref 70–99)
Potassium: 3.6 mmol/L (ref 3.5–5.2)
Sodium: 143 mmol/L (ref 134–144)
Total Protein: 6.6 g/dL (ref 6.0–8.5)
eGFR: 86 mL/min/{1.73_m2} (ref >59)

## 2021-10-31 LAB — LIPID PANEL
Chol/HDL Ratio: 2.6 ratio (ref 0.0–4.4)
Cholesterol, Total: 208 mg/dL — ABNORMAL HIGH (ref 100–199)
HDL: 81 mg/dL (ref >39)
LDL Chol Calc (NIH): 98 mg/dL (ref 0–99)
Triglycerides: 171 mg/dL — ABNORMAL HIGH (ref 0–149)
VLDL Cholesterol Cal: 29 mg/dL (ref 5–40)

## 2021-10-31 LAB — RABIES NEUT.ABS TITRAT.(RFFIT)

## 2021-10-31 LAB — HEPATITIS C ANTIBODY: Hep C Virus Ab: NONREACTIVE

## 2021-10-31 LAB — VITAMIN D 25 HYDROXY (VIT D DEFICIENCY, FRACTURES): Vit D, 25-Hydroxy: 32.8 ng/mL (ref 30.0–100.0)

## 2021-11-03 ENCOUNTER — Encounter: Payer: Self-pay | Admitting: Gastroenterology

## 2021-11-03 ENCOUNTER — Other Ambulatory Visit: Payer: Self-pay

## 2021-11-03 ENCOUNTER — Telehealth: Payer: Self-pay

## 2021-11-03 MED ORDER — RIFAXIMIN 550 MG PO TABS
550.0000 mg | ORAL_TABLET | Freq: Three times a day (TID) | ORAL | 0 refills | Status: AC
  Filled 2021-11-03 (×2): qty 42, 14d supply, fill #0

## 2021-11-03 NOTE — Telephone Encounter (Signed)
Submitted PA on Xifaxan through cover my meds  ?

## 2021-11-03 NOTE — Telephone Encounter (Signed)
Insurance company approved medication through insurance  ?

## 2021-11-05 ENCOUNTER — Other Ambulatory Visit: Payer: Self-pay

## 2021-11-25 ENCOUNTER — Ambulatory Visit
Admission: RE | Admit: 2021-11-25 | Discharge: 2021-11-25 | Disposition: A | Discharge status: Home / Self Care | Payer: No Typology Code available for payment source | Source: Ambulatory Visit | Attending: Obstetrics and Gynecology | Admitting: Obstetrics and Gynecology

## 2021-11-25 DIAGNOSIS — Z1231 Encounter for screening mammogram for malignant neoplasm of breast: Secondary | ICD-10-CM

## 2021-12-13 ENCOUNTER — Other Ambulatory Visit: Payer: Self-pay

## 2021-12-13 ENCOUNTER — Encounter: Payer: Self-pay | Admitting: Emergency Medicine

## 2021-12-13 ENCOUNTER — Emergency Department: Payer: No Typology Code available for payment source

## 2021-12-13 ENCOUNTER — Emergency Department
Admission: EM | Admit: 2021-12-13 | Discharge: 2021-12-13 | Disposition: A | Discharge status: Home / Self Care | Payer: No Typology Code available for payment source | Attending: Emergency Medicine | Admitting: Emergency Medicine

## 2021-12-13 DIAGNOSIS — R519 Headache, unspecified: Secondary | ICD-10-CM | POA: Insufficient documentation

## 2021-12-13 DIAGNOSIS — R112 Nausea with vomiting, unspecified: Secondary | ICD-10-CM | POA: Diagnosis not present

## 2021-12-13 DIAGNOSIS — W57XXXA Bitten or stung by nonvenomous insect and other nonvenomous arthropods, initial encounter: Secondary | ICD-10-CM | POA: Diagnosis not present

## 2021-12-13 DIAGNOSIS — S1086XA Insect bite of other specified part of neck, initial encounter: Secondary | ICD-10-CM | POA: Diagnosis present

## 2021-12-13 LAB — POC URINE PREG, ED: Preg Test, Ur: NEGATIVE

## 2021-12-13 MED ORDER — DOXYCYCLINE HYCLATE 100 MG PO CAPS: 100.0000 mg | ORAL_CAPSULE | Freq: Two times a day (BID) | ORAL | 0 refills | Status: DC

## 2021-12-13 MED ORDER — KETOROLAC TROMETHAMINE 30 MG/ML IJ SOLN
30.0000 mg | Freq: Once | INTRAMUSCULAR | Status: DC
  Filled 2021-12-13: qty 1

## 2021-12-13 MED ORDER — DIPHENHYDRAMINE HCL 50 MG/ML IJ SOLN
25.0000 mg | Freq: Once | INTRAMUSCULAR | Status: AC
  Administered 2021-12-13: 25 mg via INTRAVENOUS
  Filled 2021-12-13: qty 1

## 2021-12-13 MED ORDER — KETOROLAC TROMETHAMINE 30 MG/ML IJ SOLN
30.0000 mg | Freq: Once | INTRAMUSCULAR | Status: AC
  Administered 2021-12-13: 30 mg via INTRAVENOUS

## 2021-12-13 MED ORDER — ONDANSETRON 4 MG PO TBDP
4.0000 mg | ORAL_TABLET | Freq: Once | ORAL | Status: AC
  Administered 2021-12-13: 4 mg via ORAL
  Filled 2021-12-13: qty 1

## 2021-12-13 MED ORDER — METOCLOPRAMIDE HCL 5 MG/ML IJ SOLN
10.0000 mg | Freq: Once | INTRAMUSCULAR | Status: AC
  Administered 2021-12-13: 10 mg via INTRAVENOUS
  Filled 2021-12-13: qty 2

## 2021-12-13 MED ORDER — SODIUM CHLORIDE 0.9 % IV BOLUS
1000.0000 mL | Freq: Once | INTRAVENOUS | Status: AC
  Administered 2021-12-13: 1000 mL via INTRAVENOUS

## 2021-12-13 MED ORDER — SODIUM CHLORIDE 0.9 % IV BOLUS: 1000.0000 mL | Freq: Once | INTRAVENOUS | Status: DC

## 2021-12-13 NOTE — ED Notes (Signed)
See triage note. Pt denies sensitivity to sound but reports sensitivity to light; reports bitten by tick last week at back of head and thinks could be related since mother at bedside had similar symptoms last year post tickbite and was dx with [place] spotted fever at that time; pt reports soreness at posterior head and throbbing a forehead; pt reports vomited multiple times on the way here; pt not currently vomiting; [place]; mother at bedside. Pt's resp reg/unlabored; skin dry; calmly laying on stretcher; pt denies fever.

## 2021-12-13 NOTE — ED Triage Notes (Signed)
Pt via POV from home. Pt c/o migraine, states that she has had one for the past months. States this usually happens during her menstrual cycle. States she also has been vomiting. Pt is A&Ox4 and NAD.

## 2021-12-13 NOTE — Discharge Instructions (Addendum)
Follow-up with your primary care provider if any continued problems or continued headache.  Your CT scan today was negative.  Also prescription for doxycycline was sent to the pharmacy for you to begin taking today.

## 2021-12-13 NOTE — ED Provider Notes (Signed)
With  [facility] Provider Note    Event Date/Time   First MD Initiated Contact with Patient 12/13/21 1133     (approximate)   History   Migraine   HPI  Jenna Wright is a 41 y.o. female   presents to the ED planing of a headache that has lasted for at least 1 week.  Patient states that she has had a headache for the past several months but that today this is different.  Patient denies any change in vision but does report nausea with vomiting.  She also reports that she was bitten by a tick recently and is worried that this is the cause of her headache.  No fever or rash noted.  Patient states that she has been treated for headaches in the past but has never had a CT scan.  Patient has a history of "menstrual migraines" along with psoriasis and allergic rhinitis.      Physical Exam   Triage Vital Signs: ED Triage Vitals  Enc Vitals Group     BP 12/13/21 1105 (!) 149/89     Pulse Rate 12/13/21 1105 87     Resp 12/13/21 1105 18     Temp 12/13/21 1105 98 F (36.7 C)     Temp Source 12/13/21 1105 Oral     SpO2 12/13/21 1105 95 %     Weight 12/13/21 1121 230 lb (104.3 kg)     Height 12/13/21 1121 5\' 7"  (1.702 m)     Head Circumference --      Peak Flow --      Pain Score 12/13/21 1121 7     Pain Loc --      Pain Edu? --      Excl. in GC? --     Most recent vital signs: Vitals:   12/13/21 1105 12/13/21 1138  BP: (!) 149/89 (!) 148/93  Pulse: 87 91  Resp: 18   Temp: 98 F (36.7 C)   SpO2: 95% 98%     General: Awake, no distress.  CV:  Good peripheral perfusion.  Heart regular rate and rhythm. Resp:  Normal effort.  Lung's are clear bilaterally. Abd:  No distention.  Other:  PERRLA, EOMI's, normal speech, cranial nerves II through XII grossly intact.  Good muscle strength bilaterally both upper and lower extremity.  Patient is able to ambulate without any difficulty.   ED Results / Procedures / Treatments   Labs (all labs ordered are  listed, but only abnormal results are displayed) Labs Reviewed  POC URINE PREG, ED       RADIOLOGY Cervical spine x-ray images and report were reviewed by myself from 2022.    PROCEDURES:  Critical Care performed:   Procedures   MEDICATIONS ORDERED IN ED: Medications  ondansetron (ZOFRAN-ODT) disintegrating tablet 4 mg (4 mg Oral Given 12/13/21 1220)  sodium chloride 0.9 % bolus 1,000 mL (0 mLs Intravenous Stopped 12/13/21 1437)  metoCLOPramide (REGLAN) injection 10 mg (10 mg Intravenous Given 12/13/21 1341)  diphenhydrAMINE (BENADRYL) injection 25 mg (25 mg Intravenous Given 12/13/21 1341)  ketorolac (TORADOL) 30 MG/ML injection 30 mg (30 mg Intravenous Given 12/13/21 1341)     IMPRESSION / MDM / ASSESSMENT AND PLAN / ED COURSE  I reviewed the triage vital signs and the nursing notes.   Differential diagnosis includes, but is not limited to, migraine headache, muscle contraction headache, headache secondary to tick bite, intracranial mass.  41 year old female presents to the ED with complaint of  headache.  Patient has a history of chronic headaches that have been termed "menstrual headaches" and then she also has migraine headaches.  Patient states that she has never had a CT.  Exam was benign with the exception of nausea and vomiting.  CT scan was reported as no intracranial findings.  Patient was given IV fluids along with Benadryl 25 mg, Reglan 10 mg and Toradol 30 mg.  Patient stated prior to discharge that her headache was improved.  We discussed her tick bite and she agrees to take the doxycycline prophylactically.  She will follow-up with her PCP if any continued problems.    FINAL CLINICAL IMPRESSION(S) / ED DIAGNOSES   Final diagnoses:  Chronic nonintractable headache, unspecified headache type  Tick bite of neck, initial encounter     Rx / DC Orders   ED Discharge Orders          Ordered    doxycycline (VIBRAMYCIN) 100 MG capsule  2 times daily         12/13/21 1429             Note:  This document was prepared using Dragon voice recognition software and may include unintentional dictation errors.   Tommi Rumps, PA-C 12/13/21 1439    Minna Antis, MD 12/13/21 Mikle Bosworth

## 2022-01-02 ENCOUNTER — Other Ambulatory Visit: Payer: Self-pay

## 2022-02-18 ENCOUNTER — Ambulatory Visit (INDEPENDENT_AMBULATORY_CARE_PROVIDER_SITE_OTHER): Payer: No Typology Code available for payment source | Admitting: Physician Assistant

## 2022-02-18 ENCOUNTER — Other Ambulatory Visit: Payer: Self-pay

## 2022-02-18 ENCOUNTER — Encounter: Payer: Self-pay | Admitting: Physician Assistant

## 2022-02-18 ENCOUNTER — Ambulatory Visit: Payer: Self-pay | Admitting: *Deleted

## 2022-02-18 VITALS — BP 139/77 | HR 81 | Ht 67.0 in | Wt 234.7 lb

## 2022-02-18 DIAGNOSIS — J301 Allergic rhinitis due to pollen: Secondary | ICD-10-CM | POA: Diagnosis not present

## 2022-02-18 DIAGNOSIS — H9209 Otalgia, unspecified ear: Secondary | ICD-10-CM

## 2022-02-18 MED ORDER — CIPROFLOXACIN-DEXAMETHASONE 0.3-0.1 % OT SUSP
4.0000 [drp] | Freq: Two times a day (BID) | OTIC | 0 refills | Status: DC
  Filled 2022-02-18: qty 7.5, 7d supply, fill #0

## 2022-02-18 MED ORDER — CIPRO HC 0.2-1 % OT SUSP
3.0000 [drp] | Freq: Two times a day (BID) | OTIC | 0 refills | Status: DC
  Filled 2022-02-18: qty 10, 7d supply, fill #0

## 2022-02-18 NOTE — Telephone Encounter (Signed)
  Chief Complaint: Left ear red, swollen, painful and popping Symptoms: above Frequency: 2 days now Pertinent Negatives: Patient denies drainage or fever Disposition: [] ED /[] Urgent Care (no appt availability in office) / [x] Appointment(In office/virtual)/ []  [facility]/ [] Home Care/ [] Refused Recommended Disposition /[] [facility]/ []  Follow-up with PCP Additional Notes: Appt made for today with , PA for 3:40.

## 2022-02-18 NOTE — Telephone Encounter (Signed)
Reason for Disposition  Earache  (Exceptions: brief ear pain of < 60 minutes duration, earache occurring during air travel  Answer Assessment - Initial Assessment Questions 1. LOCATION: "Which ear is involved?"     Left ear is swollen and painful.   The Q Tip would not go into my ear after my shower and my ear is popping.   No fever.   I put the ear scope in there and it's red and swollen.    2. ONSET: "When did the ear start hurting"      Day before yesterday.   This has happened before.  It usually goes away after a day but this has been going on flor 2 days. I have chronic sinus congestion.  No different than my usual. 3. SEVERITY: "How bad is the pain?"  (Scale 1-10; mild, moderate or severe)   - MILD (1-3): doesn't interfere with normal activities    - MODERATE (4-7): interferes with normal activities or awakens from sleep    - SEVERE (8-10): excruciating pain, unable to do any normal activities      Moderate 4. URI SYMPTOMS: "Do you have a runny nose or cough?"     no 5. FEVER: "Do you have a fever?" If Yes, ask: "What is your temperature, how was it measured, and when did it start?"     No  6. CAUSE: "Have you been swimming recently?", "How often do you use Q-TIPS?", "Have you had any recent air travel or scuba diving?"     I can't get the Q Tip in.     I bought the scope for wax removal.   7. OTHER SYMPTOMS: "Do you have any other symptoms?" (e.g., headache, stiff neck, dizziness, vomiting, runny nose, decreased hearing)     No drainage or dizziness. 8. PREGNANCY: "Is there any chance you are pregnant?" "When was your last menstrual period?"     Not asked  Protocols used: Davina Poke

## 2022-02-18 NOTE — Progress Notes (Signed)
I,Sha'taria Tyson,acting as a Neurosurgeon for [employer], PA-C.,have documented all relevant documentation on the behalf of Debera Lat, PA-C,as directed by  [employer], PA-C while in the presence of [employer], PA-C.   Established patient visit   Patient: Jenna Wright   DOB: [birth year]   41 y.o. Female  MRN: [id number] Visit Date: 02/18/2022  Today's healthcare provider: Debera Lat, PA-C   CC: otalgia  Subjective    Otalgia  There is pain in the left ear. This is a new problem. The current episode started in the past 7 days. The problem occurs every few hours. The problem has been gradually improving. There has been no fever. Associated symptoms include hearing loss. Associated symptoms comments: Red, swollen, popping. She has tried nothing for the symptoms.    Takes a ibuprofen before going to bed with some relief. States she has had this issue before but "it never lasted this long. "  Hx of frequent ear infections/ during childhood No hx of tympanostomy tube placement Medications: Outpatient Medications Prior to Visit  Medication Sig   doxycycline (VIBRAMYCIN) 100 MG capsule Take 1 capsule (100 mg total) by mouth 2 (two) times daily. (Patient not taking: Reported on 02/18/2022)   drospirenone-ethinyl estradiol (YAZ) 3-0.02 MG tablet TAKE 1 TABLET BY MOUTH DAILY. (Patient not taking: Reported on 02/18/2022)   No facility-administered medications prior to visit.    Review of Systems  HENT:  Positive for ear pain and hearing loss.   See HPI     Objective    There were no vitals taken for this visit.   Physical Exam Vitals reviewed.  Constitutional:      General: She is not in acute distress.    Appearance: Normal appearance. She is well-developed. She is not diaphoretic.  HENT:     Head: Normocephalic and atraumatic.     Right Ear: Tympanic membrane, ear canal and external ear normal.     Ears:     Comments: Erythematous TM , no ear discharge, no  bulging TM Positive tragus test/left ear Eyes:     General: No scleral icterus.    Conjunctiva/sclera: Conjunctivae normal.  Neck:     Thyroid: No thyromegaly.  Cardiovascular:     Rate and Rhythm: Normal rate and regular rhythm.     Pulses: Normal pulses.     Heart sounds: Normal heart sounds. No murmur heard. Pulmonary:     Effort: Pulmonary effort is normal. No respiratory distress.     Breath sounds: Normal breath sounds. No wheezing, rhonchi or rales.  Musculoskeletal:     Cervical back: Neck supple.     Right lower leg: No edema.     Left lower leg: No edema.  Lymphadenopathy:     Cervical: No cervical adenopathy.  Skin:    General: Skin is warm and dry.     Findings: No rash.  Neurological:     Mental Status: She is alert and oriented to person, place, and time. Mental status is at baseline.  Psychiatric:        Mood and Affect: Mood normal.        Behavior: Behavior normal.     No results found for any visits on 02/18/22.  Assessment & Plan     1. Otalgia, unspecified laterality Could be due to otitis externa Normal vitals, no fever, abnormal otic exam - ciprofloxacin-dexamethasone (CIPRODEX) OTIC suspension; Place 4 drops into both ears 2 (two) times daily.  Dispense: 7.5 mL;  Refill: 0  2. Seasonal allergic rhinitis due to pollen  - Ambulatory referral to Allergy  [facility] as needed The patient was advised to call back or seek an in-person evaluation if the symptoms worsen or if the condition fails to improve as anticipated.  I discussed the assessment and treatment plan with the patient. The patient was provided an opportunity to ask questions and all were answered. The patient agreed with the plan and demonstrated an understanding of the instructions.  The entirety of the information documented in the History of Present Illness, Review of Systems and Physical Exam were personally obtained by me. Portions of this information were initially documented by the CMA and  reviewed by me for thoroughness and accuracy.  Portions of this note were created using dictation software and may contain typographical errors.     Debera Lat, PA-C  [facility] [phone] (phone) [phone] (fax)  [facility]'

## 2022-02-19 ENCOUNTER — Encounter: Payer: Self-pay | Admitting: Physician Assistant

## 2022-02-27 ENCOUNTER — Telehealth: Payer: Self-pay | Admitting: Physician Assistant

## 2022-02-27 NOTE — Telephone Encounter (Signed)
Talked with pt and we discussed that most likely she has otitis media with effusion which will resolve over the course of 12 wks. Considering that OME could be due to allergic rhinitis, Pt was advised to proceed with zyrtec, nasal saline rinse and nasal steroids. Pt agreed and expressed her understanding. If symptoms worsen pt was advised to see [place] in - person. If she might have any questions, pt was advised to send a message through my chart.

## 2022-06-01 ENCOUNTER — Encounter: Payer: Self-pay | Admitting: Physician Assistant

## 2022-07-28 ENCOUNTER — Other Ambulatory Visit: Payer: Self-pay

## 2022-07-28 DIAGNOSIS — J301 Allergic rhinitis due to pollen: Secondary | ICD-10-CM

## 2022-09-01 ENCOUNTER — Ambulatory Visit: Payer: 59 | Admitting: Internal Medicine

## 2022-09-01 ENCOUNTER — Other Ambulatory Visit: Payer: Self-pay

## 2022-09-01 ENCOUNTER — Encounter: Payer: Self-pay | Admitting: Internal Medicine

## 2022-09-01 VITALS — BP 130/88 | HR 88 | Temp 98.2°F | Ht 67.5 in | Wt 229.0 lb

## 2022-09-01 DIAGNOSIS — J3089 Other allergic rhinitis: Secondary | ICD-10-CM

## 2022-09-01 DIAGNOSIS — R239 Unspecified skin changes: Secondary | ICD-10-CM

## 2022-09-01 DIAGNOSIS — T63481A Toxic effect of venom of other arthropod, accidental (unintentional), initial encounter: Secondary | ICD-10-CM

## 2022-09-01 DIAGNOSIS — J302 Other seasonal allergic rhinitis: Secondary | ICD-10-CM

## 2022-09-01 DIAGNOSIS — Z880 Allergy status to penicillin: Secondary | ICD-10-CM

## 2022-09-01 DIAGNOSIS — L270 Generalized skin eruption due to drugs and medicaments taken internally: Secondary | ICD-10-CM

## 2022-09-01 DIAGNOSIS — T360X5A Adverse effect of penicillins, initial encounter: Secondary | ICD-10-CM

## 2022-09-01 DIAGNOSIS — R42 Dizziness and giddiness: Secondary | ICD-10-CM

## 2022-09-01 DIAGNOSIS — J310 Chronic rhinitis: Secondary | ICD-10-CM

## 2022-09-01 MED ORDER — FLUTICASONE PROPIONATE 50 MCG/ACT NA SUSP
2.0000 | Freq: Every day | NASAL | 5 refills | Status: DC
  Filled 2022-09-01: qty 16, 30d supply, fill #0
  Filled 2022-10-07: qty 16, 30d supply, fill #1

## 2022-09-01 MED ORDER — CETIRIZINE HCL 10 MG PO TABS
10.0000 mg | ORAL_TABLET | Freq: Every day | ORAL | 5 refills | Status: DC
  Filled 2022-09-01: qty 30, 30d supply, fill #0

## 2022-09-01 MED ORDER — AZELASTINE HCL 0.1 % NA SOLN
1.0000 | Freq: Two times a day (BID) | NASAL | 5 refills | Status: DC | PRN
  Filled 2022-09-01: qty 30, 30d supply, fill #0
  Filled 2022-10-07: qty 30, 30d supply, fill #1

## 2022-09-01 NOTE — Patient Instructions (Addendum)
Allergic Rhinitis: - Positive skin test 08/2022: [place] grass, dust mites, mold - Avoidance measures discussed. - Use nasal saline rinses before nose sprays such as with Neilmed Sinus Rinse bottle.  Use distilled water.   - Use Flonase 2 sprays each nostril daily. Aim upward and outward. - Use Azelastine 1-2 sprays each nostril twice daily as needed. Aim upward and outward. - Use Zyrtec 10 mg daily.  - Consider allergy shots as long term control of your symptoms by teaching your immune system to be more tolerant of your allergy triggers - If vertigo like symptoms persist despite treatment for your allergies as discussed above, discuss with PCP further as that is likely not the cause and might need to see ENT.   Large Local Reaction to Stinging Insect: - based on today's history, your previous reaction is consistent with a large local reaction and cutaneous reaction. - your risk of a severe systemic reaction remains low and we do not need to do anything different at this time - if you are stung again and have other symptoms besides hives or localized swelling, please let us know as that would change our management  Penicillin Allergy - Your history of reaction is low so recommend a direct amoxicillin challenge when able.    ALLERGEN AVOIDANCE MEASURES Dust Mites Use central air conditioning and heat; and change the filter monthly.  Pleated filters work better than mesh filters.  Electrostatic filters may also be used; wash the filter monthly.  Window air conditioners may be used, but do not clean the air as well as a central air conditioner.  Change or wash the filter monthly. Keep windows closed.  Do not use attic fans.   Encase the mattress, box springs and pillows with zippered, dust proof covers. Wash the bed linens in hot water weekly.   Remove carpet, especially from the bedroom. Remove stuffed animals, throw pillows, dust ruffles, heavy drapes and other items that collect dust from  the bedroom. Do not use a humidifier.   Use wood, vinyl or leather furniture instead of cloth furniture in the bedroom. Keep the indoor humidity at 30 - 40%.  Monitor with a humidity gauge.  Molds - Indoor avoidance Use air conditioning to reduce indoor humidity.  Do not use a humidifier. Keep indoor humidity at 30 - 40%.  Use a dehumidifier if needed. In the bathroom use an exhaust fan or open a window after showering.  Wipe down damp surfaces after showering.  Clean bathrooms with a mold-killing solution (diluted bleach, or products like Tilex, etc) at least once a month. In the kitchen use an exhaust fan to remove steam from cooking.  Throw away spoiled foods immediately, and empty garbage daily.  Empty water pans below self-defrosting refrigerators frequently. Vent the clothes dryer to the outside. Limit indoor houseplants; mold grows in the dirt.  No houseplants in the bedroom. Remove carpet from the bedroom. Encase the mattress and box springs with a zippered encasing.  Molds - Outdoor avoidance Avoid being outside when the grass is being mowed, or the ground is tilled. Avoid playing in leaves, pine straw, hay, etc.  Dead plant materials contain mold. Avoid going into barns or grain storage areas. Remove leaves, clippings and compost from around the home.  Pollen Avoidance Pollen levels are highest during the mid-day and afternoon.  Consider this when planning outdoor activities. Avoid being outside when the grass is being mowed, or wear a mask if the pollen-allergic person must be the one  to mow the grass. Keep the windows closed to keep pollen outside of the home. Use an air conditioner to filter the air. Take a shower, wash hair, and change clothing after working or playing outdoors during pollen season.

## 2022-09-01 NOTE — Progress Notes (Signed)
NEW PATIENT  Date of Service/Encounter:  09/01/22  Consult requested by: Mardene Speak, PA-C   Subjective:   Jenna Wright (DOB: [birth year]) is a 42 y.o. female who presents to the clinic on 09/01/2022 with a chief complaint of Establish Care, Allergy Testing, Nasal Congestion, and Sinus Problem (Pt c/o constant drainage x3 years) .    History obtained from: chart review and patient.   Rhinitis:  Started about 3 years ago.  It is worse in the morning.  Symptoms include: nasal congestion, rhinorrhea, and post nasal drainage] Also gets ear popping and feels unstable similar to vertigo.   Occurs year-round Potential triggers: none  Treatments tried:  Ciprodex without relief.  Claritin or Zyrtec PRN; last use was over a month ago Rarely Claritin D.  Afrin PRN infrequently with cold  PRN Netti pot   Previous allergy testing: no History of reflux/heartburn: no History of sinus surgery: no Nonallergic triggers: none    Concern for Stinging Insect Allergy: Insect of concern: bee sting History of reaction: she got stung on her lip and then maybe had throat swelling but no other symptoms.  She isn't sure of the throat swelling because she took benadryl and prednisone right away.  She has been stung other places and only had localized hive and swelling, no throat swelling or other symptoms.   Carries an epinephrine autoinjector: yes  Concern for Drug Allergy: Drug of concern: penicillin  History of reaction: rash as a child but thinks she got amoxicillin and did fine when she was young.  Past Medical History: Past Medical History:  Diagnosis Date   Allergic rhinitis    Angio-edema    Menstrual migraine    Obesity    Psoriasis    Urticaria     Past Surgical History: Past Surgical History:  Procedure Laterality Date   CHOLECYSTECTOMY     COLONOSCOPY WITH PROPOFOL N/A 06/19/2021   Procedure: COLONOSCOPY WITH PROPOFOL;  Surgeon: Lin Landsman, MD;   Location: [facility] ENDOSCOPY;  Service: Gastroenterology;  Laterality: N/A;   ESOPHAGOGASTRODUODENOSCOPY N/A 06/19/2021   Procedure: ESOPHAGOGASTRODUODENOSCOPY (EGD);  Surgeon: Lin Landsman, MD;  Location: [facility] ENDOSCOPY;  Service: Gastroenterology;  Laterality: N/A;    Family History: Family History  Problem Relation Age of Onset   Autoimmune disease Mother    Diabetes type II Father    Psoriasis Maternal Aunt        with psoriatic arthritis   Melanoma Maternal Uncle        with brain mets   Eczema Paternal Aunt    Basal cell carcinoma Maternal Grandmother    Colon cancer Maternal Grandmother 80   Congestive Heart Failure Maternal Grandmother    Lung cancer Maternal Grandmother 80   Atrial fibrillation Maternal Grandmother    Alzheimer's disease Maternal Grandfather    Diabetes Paternal Grandmother    Breast cancer Neg Hx     Social History:  Lives in a 92 year house Flooring in bedroom: carpet Pets: cat and dog Tobacco use/exposure: none Job: dietician at [employer]  Medication List:  Allergies as of 09/01/2022       Reactions   Other Shortness Of Breath, Swelling   Bee stings   Penicillins Hives, Rash        Medication List        Accurate as of September 01, 2022  4:55 PM. If you have any questions, ask your nurse or doctor.          cetirizine 10 MG  tablet Commonly known as: ZYRTEC Take 10 mg by mouth 2 (two) times daily as needed for allergies.   ciprofloxacin-dexamethasone OTIC suspension Commonly known as: Ciprodex Place 4 drops into both ears 2 (two) times daily.   doxycycline 100 MG capsule Commonly known as: VIBRAMYCIN Take 1 capsule (100 mg total) by mouth 2 (two) times daily.   drospirenone-ethinyl estradiol 3-0.02 MG tablet Commonly known as: YAZ TAKE 1 TABLET BY MOUTH DAILY.   loratadine 10 MG tablet Commonly known as: CLARITIN Take 10 mg by mouth daily as needed for allergies.         REVIEW OF SYSTEMS: Pertinent positives and  negatives discussed in HPI.   Objective:   Physical Exam: BP 130/88   Pulse 88   Temp 98.2 F (36.8 C)   Ht 5' 7.5" (1.715 m)   Wt 229 lb (103.9 kg)   SpO2 97%   BMI 35.34 kg/m  Body mass index is 35.34 kg/m. GEN: alert, well developed HEENT: clear conjunctiva, TM grey and translucent, nose with + inferior turbinate hypertrophy, pale nasal mucosa, slight clear rhinorrhea, + cobblestoning HEART: regular rate and rhythm, no murmur LUNGS: clear to auscultation bilaterally, no coughing, unlabored respiration ABDOMEN: soft, non distended  SKIN: no rashes or lesions  Reviewed:  08/29/2020: seen by Terrilee Croak PA for dizziness, feeling unbalance lasting a few minutes, relieved by sitting down, thought to be vertigo.  Started on meclizine PRN.   02/18/2022: seen by Ostwalt PA for otalgia and allergic rhinitis. Started on Ciprodex and referred to Allergy.   12/13/2021: seen in ED with headaches with nausea/vomiting and also with tick bite. Underwent CT head that was unremarkable.  Also has had issues with vertigo like symptoms in the past. Started on doxycycline. Given Reglan, Benadryl, Toradol in ED.    Skin Testing:  Skin prick testing was placed, which includes aeroallergens/foods, histamine control, and saline control.  Verbal consent was obtained prior to placing test.  Patient tolerated procedure well.  Allergy testing results were read and interpreted by myself, documented by clinical staff. Adequate positive and negative control.  Results discussed with patient/family.  Airborne Adult Perc - 09/01/22 1400     Time Antigen Placed 1444    Allergen Manufacturer Lavella Hammock    Location Back    Number of Test 59    1. Control-Buffer 50% Glycerol Negative    2. Control-Histamine 1 mg/ml 3+    3. Albumin saline Negative    4. [place] Negative    5. [place] Negative    6. Johnson Negative    7. Fenwood Blue Negative    8. Meadow Fescue Negative    9. Perennial Rye Negative    10. Sweet  Vernal Negative    11. Timothy Negative    12. Cocklebur Negative    13. Burweed Marshelder Negative    14. Ragweed, short Negative    15. Ragweed, Giant Negative    16. Plantain,  English Negative    17. Lamb's Quarters Negative    18. Sheep Sorrell Negative    19. Rough Pigweed Negative    20. Marsh Elder, Rough Negative    21. Mugwort, Common Negative    22. Ash mix Negative    23. Birch mix Negative    24. Beech American Negative    25. Box, Elder Negative    26. Cedar, red Negative    27. Cottonwood, [place] Negative    28. Elm mix Negative    29. Hickory Negative  30. Maple mix Negative    31. Oak, [place] mix Negative    32. Pecan Pollen Negative    33. Pine mix Negative    34. Sycamore Eastern Negative    35. Helen, Black Pollen Negative    36. Alternaria alternata Negative    37. Cladosporium Herbarum Negative    38. Aspergillus mix Negative    39. Penicillium mix Negative    40. Bipolaris sorokiniana (Helminthosporium) Negative    41. Drechslera spicifera (Curvularia) Negative    42. Mucor plumbeus Negative    43. Fusarium moniliforme Negative    44. Aureobasidium pullulans (pullulara) Negative    45. Rhizopus oryzae Negative    46. Botrytis cinera Negative    47. Epicoccum nigrum Negative    48. Phoma betae Negative    49. Candida Albicans Negative    50. Trichophyton mentagrophytes Negative    51. Mite, D Farinae  5,000 AU/ml Negative    52. Mite, D Pteronyssinus  5,000 AU/ml Negative    53. Cat Hair 10,000 BAU/ml Negative    54.  Dog Epithelia Negative    55. Mixed Feathers Negative    56. Horse Epithelia Negative    57. Cockroach, German Negative    58. Mouse Negative    59. Tobacco Leaf Negative             Intradermal - 09/01/22 1500     Time Antigen Placed 1523    Allergen Manufacturer Lavella Hammock    Location Arm    Number of Test 15    Control 3+    [place] 3+    Johnson Negative    7 Grass Negative    Ragweed mix Negative    Weed mix  Negative    Tree mix Negative    Mold 1 Negative    Mold 2 3+    Mold 3 2+    Mold 4 2+    Cat Negative    Dog Negative    Cockroach Negative    Mite mix 2+               Assessment:   1. Chronic rhinitis   2. Vertigo   3. 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 Adverse effect of penicillin, initial encounter   5. Dermatitis due to drug taken internally     Plan/Recommendations:   Allergic Rhinitis: - Positive skin test 08/2022: [place] grass, dust mites, mold - Avoidance measures discussed. - Use nasal saline rinses before nose sprays such as with Neilmed Sinus Rinse bottle.  Use distilled water.   - Use Flonase 2 sprays each nostril daily. Aim upward and outward. - Use Azelastine 1-2 sprays each nostril twice daily as needed. Aim upward and outward. - Use Zyrtec 10 mg daily.  - Consider allergy shots as long term control of your symptoms by teaching your immune system to be more tolerant of your allergy triggers - If vertigo like symptoms persist despite treatment for your allergies as discussed above, discuss with PCP further as that is likely not the cause and might need to see ENT.   Large Local Reaction to Stinging Insect: - based on today's history, your previous reaction is consistent with a large local reaction and cutaneous reaction. - your risk of a severe systemic reaction remains low and we do not need to do anything different at this time - if you are stung again and have other symptoms besides hives or localized swelling, please let us know  as that would change our management  Penicillin Allergy - Your history of reaction is low so recommend a direct amoxicillin challenge when able.       Return in about 6 weeks (around 10/13/2022).  Harlon Flor, MD [facility]

## 2022-10-05 NOTE — Progress Notes (Unsigned)
PCP:  Mardene Speak, PA-C   No chief complaint on file.    HPI:      [name] is a 42 y.o. G0P0000 who LMP was No LMP recorded., presents today for her annual examination.  Her menses are absent now with OCPs, or has occas spotting for a few days on placebo pills  Dysmenorrhea none. She does not have intermenstrual bleeding. Has menstrual migraines 1 wk before menses, but less severe when changed to yaz with better sx control.   Sex activity: single partner, contraception - OCP (estrogen/progesterone).  Last Pap: 10/02/21 Results were: no abnormalities /neg HPV DNA  Hx of STDs: none  Mammogram: 11/25/21 Results were normal, repeat in 12 months There is no FH of breast cancer. There is no FH of ovarian cancer. The patient does do occas self-breast exams. Has RT breast pain intermittently since last mammo. Had neg exam with me 12/22. Can't correlate with cycle. Drinks some caffeine. No masses.   Tobacco use: The patient denies current or previous tobacco use. Alcohol use: none No drug use.  Exercise: very active  Colonoscopy/EGD 12/22 with [facility]  She does get adequate calcium and Vitamin D in her diet. Labs with PCP.   Past Medical History:  Diagnosis Date   Allergic rhinitis    Angio-edema    Menstrual migraine    Obesity    Psoriasis    Urticaria     Past Surgical History:  Procedure Laterality Date   CHOLECYSTECTOMY     COLONOSCOPY WITH PROPOFOL N/A 06/19/2021   Procedure: COLONOSCOPY WITH PROPOFOL;  Surgeon: Lin Landsman, MD;  Location: [facility] ENDOSCOPY;  Service: Gastroenterology;  Laterality: N/A;   ESOPHAGOGASTRODUODENOSCOPY N/A 06/19/2021   Procedure: ESOPHAGOGASTRODUODENOSCOPY (EGD);  Surgeon: Lin Landsman, MD;  Location: [facility] ENDOSCOPY;  Service: Gastroenterology;  Laterality: N/A;    Family History  Problem Relation Age of Onset   Autoimmune disease Mother    Diabetes type II Father    Psoriasis Maternal Aunt        with  psoriatic arthritis   Melanoma Maternal Uncle        with brain mets   Eczema Paternal Aunt    Basal cell carcinoma Maternal Grandmother    Colon cancer Maternal Grandmother 80   Congestive Heart Failure Maternal Grandmother    Lung cancer Maternal Grandmother 80   Atrial fibrillation Maternal Grandmother    Alzheimer's disease Maternal Grandfather    Diabetes Paternal Grandmother    Breast cancer Neg Hx     Social History   Socioeconomic History   Marital status: Single    Spouse name: Not on file   Number of children: 0   Years of education: Not on file   Highest education level: Not on file  Occupational History   Occupation: dietician  Tobacco Use   Smoking status: Former    Packs/day: 0.50    Years: 2.00    Additional pack years: 0.00    Total pack years: 1.00    Types: Cigarettes    Quit date: 2005    Years since quitting: 19.2    Passive exposure: Never   Smokeless tobacco: Never  Vaping Use   Vaping Use: Never used  Substance and Sexual Activity   Alcohol use: No   Drug use: No   Sexual activity: Yes    Partners: Male    Birth control/protection: Pill  Other Topics Concern   Not on file  Social History Narrative  Not on file   Social Determinants of Health   Financial Resource Strain: Not on file  Food Insecurity: Not on file  Transportation Needs: Not on file  Physical Activity: Not on file  Stress: Not on file  Social Connections: Not on file  Intimate Partner Violence: Not on file    No outpatient medications have been marked as taking for the 10/06/22 encounter (Appointment) with Maricruz Lucero, Deirdre Evener, PA-C.     ROS:  Review of Systems  Constitutional:  Negative for fatigue, fever and unexpected weight change.  Respiratory:  Negative for cough, shortness of breath and wheezing.   Cardiovascular:  Negative for chest pain, palpitations and leg swelling.  Gastrointestinal:  Negative for blood in stool, constipation, diarrhea, nausea and  vomiting.  Endocrine: Negative for cold intolerance, heat intolerance and polyuria.  Genitourinary:  Negative for dyspareunia, dysuria, flank pain, frequency, genital sores, hematuria, menstrual problem, pelvic pain, urgency, vaginal bleeding, vaginal discharge and vaginal pain.  Musculoskeletal:  Negative for back pain, joint swelling and myalgias.  Skin:  Negative for rash.  Neurological:  Negative for dizziness, syncope, light-headedness, numbness and headaches.  Hematological:  Negative for adenopathy.  Psychiatric/Behavioral:  Negative for agitation, confusion, sleep disturbance and suicidal ideas. The patient is not nervous/anxious.      Objective: There were no vitals taken for this visit.   Physical Exam Constitutional:      Appearance: She is well-developed.  Genitourinary:     Vulva normal.     Right Labia: No rash, tenderness or lesions.    Left Labia: No tenderness, lesions or rash.    No vaginal discharge, erythema or tenderness.      Right Adnexa: not tender and no mass present.    Left Adnexa: not tender and no mass present.    No cervical motion tenderness, friability or polyp.     Uterus is not enlarged or tender.  Breasts:    Right: No mass, nipple discharge, skin change or tenderness.     Left: No mass, nipple discharge, skin change or tenderness.  Neck:     Thyroid: No thyromegaly.  Cardiovascular:     Rate and Rhythm: Normal rate and regular rhythm.     Heart sounds: Normal heart sounds. No murmur heard. Pulmonary:     Effort: Pulmonary effort is normal.     Breath sounds: Normal breath sounds.  Abdominal:     Palpations: Abdomen is soft.     Tenderness: There is no abdominal tenderness. There is no guarding or rebound.  Musculoskeletal:        General: Normal range of motion.     Cervical back: Normal range of motion.  Lymphadenopathy:     Cervical: No cervical adenopathy.  Neurological:     General: No focal deficit present.     Mental Status:  She is alert and oriented to person, place, and time.     Cranial Nerves: No cranial nerve deficit.  Skin:    General: Skin is warm and dry.  Psychiatric:        Mood and Affect: Mood normal.        Behavior: Behavior normal.        Thought Content: Thought content normal.        Judgment: Judgment normal.  Vitals reviewed.     Assessment/Plan: Encounter for annual routine gynecological examination  Cervical cancer screening - Plan: Cytology - PAP  Screening for HPV (human papillomavirus) - Plan: Cytology - PAP  Encounter for  screening mammogram for malignant neoplasm of breast - Plan: MM 3D SCREEN BREAST BILATERAL; pt to schedule mammo  Encounter for surveillance of contraceptive pills - Plan: drospirenone-ethinyl estradiol (YAZ) 3-0.02 MG tablet; OCP RF  Breast pain--RT breast, intermittent. D/C caffeine, add Vit E 400 IU BID. F/u prn.    No orders of the defined types were placed in this encounter.           GYN counsel adequate intake of calcium and vitamin D     F/U  No follow-ups on file.  Elizjah Noblet B. Kelyn Ponciano, PA-C 10/05/2022 1:48 PM

## 2022-10-06 ENCOUNTER — Encounter: Payer: Self-pay | Admitting: Obstetrics and Gynecology

## 2022-10-06 ENCOUNTER — Other Ambulatory Visit: Payer: Self-pay

## 2022-10-06 ENCOUNTER — Ambulatory Visit (INDEPENDENT_AMBULATORY_CARE_PROVIDER_SITE_OTHER): Payer: 59 | Admitting: Obstetrics and Gynecology

## 2022-10-06 VITALS — BP 126/74 | HR 77 | Ht 67.0 in | Wt 232.0 lb

## 2022-10-06 DIAGNOSIS — Z01419 Encounter for gynecological examination (general) (routine) without abnormal findings: Secondary | ICD-10-CM

## 2022-10-06 DIAGNOSIS — G43839 Menstrual migraine, intractable, without status migrainosus: Secondary | ICD-10-CM | POA: Insufficient documentation

## 2022-10-06 DIAGNOSIS — Z1231 Encounter for screening mammogram for malignant neoplasm of breast: Secondary | ICD-10-CM

## 2022-10-06 MED ORDER — RIZATRIPTAN BENZOATE 10 MG PO TBDP
10.0000 mg | ORAL_TABLET | ORAL | 1 refills | Status: DC | PRN
  Filled 2022-10-06: qty 10, 30d supply, fill #0

## 2022-10-06 NOTE — Patient Instructions (Addendum)
I value your feedback and you entrusting us with your care. If you get a [facility] patient survey, I would appreciate you taking the time to let us know about your experience today. Thank you!  [facility]: [phone]      

## 2022-10-07 ENCOUNTER — Other Ambulatory Visit: Payer: Self-pay

## 2022-10-13 ENCOUNTER — Ambulatory Visit: Payer: 59 | Admitting: Internal Medicine

## 2022-10-20 ENCOUNTER — Other Ambulatory Visit: Payer: Self-pay

## 2022-10-20 ENCOUNTER — Ambulatory Visit (INDEPENDENT_AMBULATORY_CARE_PROVIDER_SITE_OTHER): Payer: 59 | Admitting: Physician Assistant

## 2022-10-20 VITALS — BP 131/85 | HR 90 | Temp 98.1°F | Wt 229.7 lb

## 2022-10-20 DIAGNOSIS — J301 Allergic rhinitis due to pollen: Secondary | ICD-10-CM | POA: Diagnosis not present

## 2022-10-20 DIAGNOSIS — G43109 Migraine with aura, not intractable, without status migrainosus: Secondary | ICD-10-CM

## 2022-10-20 DIAGNOSIS — Z0000 Encounter for general adult medical examination without abnormal findings: Secondary | ICD-10-CM | POA: Diagnosis not present

## 2022-10-20 MED ORDER — HYDROXYZINE PAMOATE 25 MG PO CAPS
25.0000 mg | ORAL_CAPSULE | Freq: Three times a day (TID) | ORAL | 0 refills | Status: DC | PRN
  Filled 2022-10-20: qty 30, 10d supply, fill #0

## 2022-10-20 NOTE — Progress Notes (Unsigned)
      Established patient visit   Patient: Jenna Wright   DOB: October 13, 1980   42 y.o. Female  MRN: [id number] Visit Date: 10/20/2022  Today's healthcare provider: Mardene Speak, PA-C   No chief complaint on file.  Subjective    HPI  Well Adult Physical: Patient here for a comprehensive physical exam.The patient reports {problems:16946} Do you take any herbs or supplements that were not prescribed by a doctor? {yes/no/not asked:9010} Are you taking calcium supplements? {yes/no:63} Are you taking aspirin daily? {yes/no:63} GU History: {gu history select gender:17814} Obgy n a few weeks, stop taking BCP, practice abstinence,  Medications: Outpatient Medications Prior to Visit  Medication Sig   azelastine (ASTELIN) 0.1 % nasal spray Place 1 spray into both nostrils 2 (two) times daily as needed for rhinitis. Use in each nostril as directed   cetirizine (ZYRTEC) 10 MG tablet Take 1 tablet (10 mg total) by mouth daily.   fluticasone (FLONASE) 50 MCG/ACT nasal spray Place 2 sprays into both nostrils daily.   loratadine (CLARITIN) 10 MG tablet Take 10 mg by mouth daily as needed for allergies.   rizatriptan (MAXALT-MLT) 10 MG disintegrating tablet Take 1 tablet (10 mg total) by mouth as needed for migraine. May repeat in 2 hours if needed   No facility-administered medications prior to visit.    Review of Systems  All other systems reviewed and are negative. Excepts   {Labs  Heme  Chem  Endocrine  Serology  Results Review (optional):23779}   Objective    LMP 09/21/2022 (Approximate)  {Show previous vital signs (optional):23777}  Physical Exam  ***  No results found for any visits on 10/20/22.  Assessment & Plan     Migraine Nurtec  The 10-year ASCVD risk score (Arnett DK, et al., 2019) is: 0.4%  No follow-ups on file.      {provider attestation***:1}   Mardene Speak, PA-C  [place] [phone] (phone) [phone]  (fax)  [place]

## 2022-10-22 ENCOUNTER — Encounter: Payer: Self-pay | Admitting: Physician Assistant

## 2022-10-22 DIAGNOSIS — J301 Allergic rhinitis due to pollen: Secondary | ICD-10-CM | POA: Insufficient documentation

## 2022-10-22 DIAGNOSIS — G43009 Migraine without aura, not intractable, without status migrainosus: Secondary | ICD-10-CM | POA: Insufficient documentation

## 2022-11-03 ENCOUNTER — Other Ambulatory Visit: Payer: Self-pay

## 2022-11-03 ENCOUNTER — Encounter: Payer: Self-pay | Admitting: Internal Medicine

## 2022-11-03 ENCOUNTER — Ambulatory Visit: Payer: 59 | Admitting: Internal Medicine

## 2022-11-03 VITALS — BP 138/74 | HR 82 | Temp 98.3°F | Resp 12 | Wt 231.5 lb

## 2022-11-03 DIAGNOSIS — J3089 Other allergic rhinitis: Secondary | ICD-10-CM | POA: Diagnosis not present

## 2022-11-03 DIAGNOSIS — L270 Generalized skin eruption due to drugs and medicaments taken internally: Secondary | ICD-10-CM

## 2022-11-03 DIAGNOSIS — R030 Elevated blood-pressure reading, without diagnosis of hypertension: Secondary | ICD-10-CM | POA: Diagnosis not present

## 2022-11-03 DIAGNOSIS — T360X5D Adverse effect of penicillins, subsequent encounter: Secondary | ICD-10-CM

## 2022-11-03 DIAGNOSIS — Z880 Allergy status to penicillin: Secondary | ICD-10-CM

## 2022-11-03 DIAGNOSIS — J302 Other seasonal allergic rhinitis: Secondary | ICD-10-CM

## 2022-11-03 MED ORDER — FLUTICASONE PROPIONATE 50 MCG/ACT NA SUSP
2.0000 | Freq: Every day | NASAL | 5 refills | Status: DC
  Filled 2022-11-03: qty 16, 30d supply, fill #0
  Filled 2022-12-27: qty 16, 30d supply, fill #1
  Filled 2023-02-21: qty 16, 30d supply, fill #2
  Filled 2023-03-22: qty 16, 30d supply, fill #3
  Filled 2023-05-02: qty 16, 30d supply, fill #4

## 2022-11-03 MED ORDER — AZELASTINE HCL 0.1 % NA SOLN
1.0000 | Freq: Two times a day (BID) | NASAL | 5 refills | Status: DC | PRN
  Filled 2022-11-03: qty 30, 100d supply, fill #0
  Filled 2023-01-25: qty 30, 50d supply, fill #1
  Filled 2023-03-19: qty 30, 50d supply, fill #2
  Filled 2023-05-03: qty 30, 50d supply, fill #3

## 2022-11-03 MED ORDER — IPRATROPIUM BROMIDE 0.06 % NA SOLN
2.0000 | Freq: Four times a day (QID) | NASAL | 5 refills | Status: DC | PRN
  Filled 2022-11-03: qty 15, 19d supply, fill #0

## 2022-11-03 NOTE — Patient Instructions (Addendum)
Allergic Rhinitis: - Positive skin test 08/2022: [place] grass, dust mites, mold - Avoidance measures discussed. - Use nasal saline rinses before nose sprays such as with Neilmed Sinus Rinse bottle.  Use distilled water.   - Use Flonase 2 sprays each nostril daily. Aim upward and outward. - Use Azelastine 1-2 sprays each nostril twice daily as needed. Aim upward and outward. - Use Ipratropium 1-2 sprays each nostril up to three times a day as needed. Aim upward and outward.   - Use Zyrtec 10 mg daily.  - Consider allergy shots as long term control of your symptoms by teaching your immune system to be more tolerant of your allergy triggers.  Penicillin Allergy - Your history of reaction is low so recommend a direct amoxicillin challenge when able.

## 2022-11-03 NOTE — Progress Notes (Addendum)
FOLLOW UP Date of Service/Encounter:  11/03/22   Subjective:  Jenna Wright (DOB: 08-02-1980) is a 42 y.o. female who [facility] for follow up for allergic rhinoconjunctivitis and penicillin allergy.   History obtained from: chart review and patient.  Last visit was with me 09/01/2022 and was SPT positive to grass, mold, DM.  Discussed INCS/INAH/OAH.  Also low risk for penicillin allergy, recommend challenge.  Since last visit, she reports doing a little better.  Initially had worsening congestion with Flonase but she kept using it and has noticed that it is better with it.  Also taking Azelastine BID which helps quickly.  Still has congestion and most importantly drainage that bothers her the most.  Tried variety of oral antihistamines without much improvement.  Saw PCP a little while ago for rhino sinusitis and was given z-pack and hydroxyzine which she stopped as it was not helping and caused heartburn. She also wonders if her weight gain has association with her allergies.    Past Medical History: Past Medical History:  Diagnosis Date   Allergic rhinitis    Angio-edema    Menstrual migraine    Obesity    Psoriasis    Urticaria     Objective:  BP (!) 140/76   Pulse 82   Temp 98.3 F (36.8 C) (Temporal)   Resp 12   Wt 231 lb 8 oz (105 kg)   LMP 09/21/2022 (Approximate)   SpO2 97%   BMI 36.26 kg/m  Body mass index is 36.26 kg/m. Physical Exam: GEN: alert, well developed HEENT: clear conjunctiva, TM grey and translucent, nose with moderate inferior turbinate hypertrophy, pink nasal mucosa, clear rhinorrhea, no cobblestoning HEART: regular rate and rhythm, no murmur LUNGS: clear to auscultation bilaterally, no coughing, unlabored respiration SKIN: no rashes or lesions  Assessment:   1. Seasonal and perennial allergic rhinitis   2. Adverse effect of penicillin, subsequent encounter   3. Dermatitis due to drug taken  internally   4. Elevated BP without diagnosis of hypertension     Plan/Recommendations:  Allergic Rhinitis: - Not well controlled.  Discussed other options like AIT but not interested at this time.  Will maximize regimen with INCS/INAH/Ipratropium. Also discussed that obesity has been associated with allergic rhinitis and weight loss would be helpful for her general health also. - Positive skin test 08/2022: [place] grass, dust mites, mold - Avoidance measures discussed. - Use nasal saline rinses before nose sprays such as with Neilmed Sinus Rinse bottle.  Use distilled water.   - Use Flonase 2 sprays each nostril daily. Aim upward and outward. - Use Azelastine 1-2 sprays each nostril twice daily as needed. Aim upward and outward. - Use Ipratropium 1-2 sprays each nostril up to three times a day as needed. Aim upward and outward.   - Use Zyrtec 10 mg daily.  - Consider allergy shots as long term control of your symptoms by teaching your immune system to be more tolerant of your allergy triggers.  Penicillin Allergy - Your history of reaction is low so recommend a direct amoxicillin challenge when able.    Elevated BP - She was running late to the clinic and had to run around so thinks that is likely the cause. Previously normal at PCP.  - Recommend checking at home in the morning.  If it remains elevated, follow up with PCP.   Return in about 6 months (around 05/05/2023).  Alesia Morin, MD [facility]

## 2022-12-01 ENCOUNTER — Ambulatory Visit
Admission: RE | Admit: 2022-12-01 | Discharge: 2022-12-01 | Disposition: A | Discharge status: Home / Self Care | Payer: 59 | Source: Ambulatory Visit | Attending: Obstetrics and Gynecology | Admitting: Obstetrics and Gynecology

## 2022-12-01 DIAGNOSIS — Z1231 Encounter for screening mammogram for malignant neoplasm of breast: Secondary | ICD-10-CM | POA: Insufficient documentation

## 2023-01-25 ENCOUNTER — Other Ambulatory Visit: Payer: Self-pay

## 2023-05-18 ENCOUNTER — Ambulatory Visit: Payer: 59 | Admitting: Internal Medicine

## 2023-05-18 ENCOUNTER — Other Ambulatory Visit: Payer: Self-pay

## 2023-05-18 ENCOUNTER — Encounter: Payer: Self-pay | Admitting: Internal Medicine

## 2023-05-18 VITALS — BP 128/82 | HR 82 | Temp 97.8°F | Resp 16 | Ht 67.0 in | Wt 219.4 lb

## 2023-05-18 DIAGNOSIS — J302 Other seasonal allergic rhinitis: Secondary | ICD-10-CM | POA: Diagnosis not present

## 2023-05-18 DIAGNOSIS — J3089 Other allergic rhinitis: Secondary | ICD-10-CM | POA: Diagnosis not present

## 2023-05-18 MED ORDER — CETIRIZINE HCL 10 MG PO TABS
10.0000 mg | ORAL_TABLET | Freq: Every day | ORAL | 5 refills | Status: DC
  Filled 2023-05-18: qty 30, 30d supply, fill #0

## 2023-05-18 MED ORDER — AZELASTINE HCL 0.1 % NA SOLN
1.0000 | Freq: Two times a day (BID) | NASAL | 5 refills | Status: DC | PRN
  Filled 2023-05-18: qty 30, 100d supply, fill #0
  Filled 2023-06-11: qty 30, 50d supply, fill #0
  Filled 2023-08-17: qty 30, 50d supply, fill #1
  Filled 2023-10-04: qty 30, 50d supply, fill #2
  Filled 2023-11-11 – 2023-11-15 (×2): qty 30, 50d supply, fill #3
  Filled 2024-01-23: qty 30, 50d supply, fill #4

## 2023-05-18 MED ORDER — IPRATROPIUM BROMIDE 0.06 % NA SOLN
2.0000 | Freq: Four times a day (QID) | NASAL | 5 refills | Status: DC | PRN
  Filled 2023-05-18: qty 15, 19d supply, fill #0
  Filled 2023-07-14: qty 15, 19d supply, fill #1

## 2023-05-18 MED ORDER — FLUTICASONE PROPIONATE 50 MCG/ACT NA SUSP
2.0000 | Freq: Every day | NASAL | 5 refills | Status: DC
  Filled 2023-05-18 – 2023-06-11 (×2): qty 16, 30d supply, fill #0
  Filled 2023-07-14: qty 16, 30d supply, fill #1
  Filled 2023-08-17: qty 16, 30d supply, fill #2
  Filled 2023-09-27: qty 16, 30d supply, fill #3
  Filled 2023-11-09: qty 16, 30d supply, fill #4
  Filled 2024-01-23: qty 16, 30d supply, fill #5

## 2023-05-18 NOTE — Patient Instructions (Addendum)
Allergic Rhinitis: - Positive skin test 08/2022: [place] grass, dust mites, mold - Avoidance measures discussed. - Use nasal saline rinses before nose sprays such as with Neilmed Sinus Rinse bottle.  Use distilled water.   - Use Flonase 2 sprays each nostril daily. Aim upward and outward. - Use Azelastine 1-2 sprays each nostril twice daily as needed. Aim upward and outward. - Use Ipratropium 1-2 sprays each nostril up to three times a day as needed. Aim upward and outward.   - Use Zyrtec 10 mg daily.  - Consider allergy shots as long term control of your symptoms by teaching your immune system to be more tolerant of your allergy triggers.  Penicillin Allergy - Your history of reaction is low so recommend a direct amoxicillin challenge when able.   Would need to hold anti histamines 3 days prior.

## 2023-05-18 NOTE — Progress Notes (Signed)
FOLLOW UP Date of Service/Encounter:  05/18/23   Subjective:  Jenna Wright (DOB: 26-Mar-1981) is a 42 y.o. female who [facility] for follow up for allergic rhinitis and penicillin allergy.  History obtained from: chart review and patient. Last visit was with me on 11/03/2022 and at the time was uncontrolled so we added Ipratropium with Flonase/Azelastine/Zyrtec.  Since last visit, reports some early AM congestion but otherwise doing a lot better.  Using Flonase/Azelastine/Zyrtec daily.  Has not need Ipratropium much.  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   Past Medical History: Past Medical History:  Diagnosis Date   Allergic rhinitis    Angio-edema    Menstrual migraine    Obesity    Psoriasis    Urticaria     Objective:  BP 128/82 (BP Location: Right Arm, Patient Position: Sitting, Cuff Size: Normal)   Pulse 82   Temp 97.8 F (36.6 C) (Temporal)   Resp 16   Ht 5\' 7"  (1.702 m)   Wt 219 lb 6.4 oz (99.5 kg)   SpO2 98%   BMI 34.36 kg/m  Body mass index is 34.36 kg/m. Physical Exam: GEN: alert, well developed HEENT: clear conjunctiva, nose with septal deviation, mild inferior turbinate hypertrophy, pink nasal mucosa, clear rhinorrhea HEART: regular rate and rhythm, no murmur LUNGS: clear to auscultation bilaterally, no coughing, unlabored respiration SKIN: no rashes or lesions  Assessment:   1. Seasonal and perennial allergic rhinitis     Plan/Recommendations:   Allergic Rhinitis: - Improved.  - Positive skin test 08/2022: [place] grass, dust mites, mold - Avoidance measures discussed. - Use nasal saline rinses before nose sprays such as with Neilmed Sinus Rinse bottle.  Use distilled water.   - Use Flonase 2 sprays each nostril daily. Aim upward and outward. - Use Azelastine 1-2 sprays each nostril twice daily as needed. Aim upward and outward. -  Use Ipratropium 1-2 sprays each nostril up to three times a day as needed. Aim upward and outward.   - Use Zyrtec 10 mg daily.  - Consider allergy shots as long term control of your symptoms by teaching your immune system to be more tolerant of your allergy triggers.  Penicillin Allergy - Your history of reaction is low so recommend a direct amoxicillin challenge when able.   Would need to hold anti histamines 3 days prior.      Return in about 6 months (around 11/16/2023).  Alesia Morin, MD [facility]

## 2023-06-13 ENCOUNTER — Other Ambulatory Visit: Payer: Self-pay

## 2023-06-14 ENCOUNTER — Other Ambulatory Visit: Payer: Self-pay

## 2023-08-18 ENCOUNTER — Other Ambulatory Visit: Payer: Self-pay

## 2023-10-05 NOTE — Progress Notes (Unsigned)
 PCP:  Debera Lat, PA-C   No chief complaint on file.    HPI:      [name] is a 43 y.o. G0P0000 who LMP was No LMP recorded., presents today for her annual examination.  Her menses are monthly off OCPs (stopped 5/23), lasting 4-5 days, mod flow, no BTB, no dysmen. Gets RT breast tenderness with menses. Also has menstrual migraines without aura for 3-4 days each month. Was doing cont dosing OCPs/Yaz for migraine prevention but no longer sexually active and pt stopped pills. HA usually relieved with ibup 800 mg and tylenol, but occas wakes with migraine with vomiting and then OTC meds don't work. Had to go to ED 5/23 for migraine cocktail; was under extreme stress with loss of partner at that time. Had neg CT head.  Pt asking for prn med for migraines. Has never done triptans. Rx maxalt given last yr  Sex activity: not currently active Last Pap: 10/02/21 Results were: no abnormalities /neg HPV DNA  Hx of STDs: none  Mammogram: 12/01/22 Results were normal, repeat in 12 months There is no FH of breast cancer. There is no FH of ovarian cancer. The patient does do occas self-breast exams.   Tobacco use: The patient denies current or previous tobacco use. Alcohol use: none No drug use.  Exercise: very active  Colonoscopy/EGD 12/22 with [facility]  She does get adequate calcium and Vitamin D in her diet. Labs with PCP.   Past Medical History:  Diagnosis Date   Allergic rhinitis    Angio-edema    Menstrual migraine    Obesity    Psoriasis    Urticaria     Past Surgical History:  Procedure Laterality Date   CHOLECYSTECTOMY     COLONOSCOPY WITH PROPOFOL N/A 06/19/2021   Procedure: COLONOSCOPY WITH PROPOFOL;  Surgeon: Toney Reil, MD;  Location: [facility] ENDOSCOPY;  Service: Gastroenterology;  Laterality: N/A;   ESOPHAGOGASTRODUODENOSCOPY N/A 06/19/2021   Procedure: ESOPHAGOGASTRODUODENOSCOPY (EGD);  Surgeon: Toney Reil, MD;  Location: [facility] ENDOSCOPY;   Service: Gastroenterology;  Laterality: N/A;    Family History  Problem Relation Age of Onset   Autoimmune disease Mother    Diabetes type II Father    Psoriasis Maternal Aunt        with psoriatic arthritis   Melanoma Maternal Uncle        with brain mets   Eczema Paternal Aunt    Basal cell carcinoma Maternal Grandmother    Colon cancer Maternal Grandmother 39   Congestive Heart Failure Maternal Grandmother    Lung cancer Maternal Grandmother 40   Atrial fibrillation Maternal Grandmother    Alzheimer's disease Maternal Grandfather    Diabetes Paternal Grandmother    Breast cancer Neg Hx     Social History   Socioeconomic History   Marital status: Single    Spouse name: Not on file   Number of children: 0   Years of education: Not on file   Highest education level: Master's degree (e.g., MA, MS, MEng, MEd, MSW, MBA)  Occupational History   Occupation: dietician  Tobacco Use   Smoking status: Former    Current packs/day: 0.00    Average packs/day: 0.5 packs/day for 2.0 years (1.0 ttl pk-yrs)    Types: Cigarettes    Start date: 2003    Quit date: 2005    Years since quitting: 20.2    Passive exposure: Never   Smokeless tobacco: Never  Vaping Use   Vaping  status: Never Used  Substance and Sexual Activity   Alcohol use: No   Drug use: No   Sexual activity: Not Currently    Partners: Male    Birth control/protection: None  Other Topics Concern   Not on file  Social History Narrative   Not on file   Social Drivers of Health   Financial Resource Strain: Low Risk  (10/16/2022)   Overall Financial Resource Strain (CARDIA)    Difficulty of Paying Living Expenses: Not hard at all  Food Insecurity: No Food Insecurity (10/16/2022)   Hunger Vital Sign    Worried About Running Out of Food in the Last Year: Never true    Ran Out of Food in the Last Year: Never true  Transportation Needs: No Transportation Needs (10/16/2022)   PRAPARE - Scientist, research (physical sciences) (Medical): No    Lack of Transportation (Non-Medical): No  Physical Activity: Sufficiently Active (10/16/2022)   Exercise Vital Sign    Days of Exercise per Week: 7 days    Minutes of Exercise per Session: 40 min  Stress: No Stress Concern Present (10/16/2022)   [employer] of Occupational Health - Occupational Stress Questionnaire    Feeling of Stress : Not at all  Social Connections: Socially Isolated (10/16/2022)   Social Connection and Isolation Panel [NHANES]    Frequency of Communication with Friends and Family: More than three times a week    Frequency of Social Gatherings with Friends and Family: More than three times a week    Attends Religious Services: Never    Database administrator or Organizations: No    Attends Engineer, structural: Not on file    Marital Status: Never married  Intimate Partner Violence: Not on file    No outpatient medications have been marked as taking for the 10/07/23 encounter Administrator, Civil Service) with Khyrie Masi B, PA-C.     ROS:  Review of Systems  Constitutional:  Negative for fatigue, fever and unexpected weight change.  Respiratory:  Negative for cough, shortness of breath and wheezing.   Cardiovascular:  Negative for chest pain, palpitations and leg swelling.  Gastrointestinal:  Negative for blood in stool, constipation, diarrhea, nausea and vomiting.  Endocrine: Negative for cold intolerance, heat intolerance and polyuria.  Genitourinary:  Negative for dyspareunia, dysuria, flank pain, frequency, genital sores, hematuria, menstrual problem, pelvic pain, urgency, vaginal bleeding, vaginal discharge and vaginal pain.  Musculoskeletal:  Negative for back pain, joint swelling and myalgias.  Skin:  Negative for rash.  Neurological:  Positive for headaches. Negative for dizziness, syncope, light-headedness and numbness.  Hematological:  Negative for adenopathy.  Psychiatric/Behavioral:  Negative for agitation,  confusion, sleep disturbance and suicidal ideas. The patient is not nervous/anxious.      Objective: There were no vitals taken for this visit.   Physical Exam Constitutional:      Appearance: She is well-developed.  Genitourinary:     Vulva normal.     Right Labia: No rash, tenderness or lesions.    Left Labia: No tenderness, lesions or rash.    No vaginal discharge, erythema or tenderness.      Right Adnexa: not tender and no mass present.    Left Adnexa: not tender and no mass present.    No cervical motion tenderness, friability or polyp.     Uterus is not enlarged or tender.  Breasts:    Right: No mass, nipple discharge, skin change or tenderness.     Left: No  mass, nipple discharge, skin change or tenderness.  Neck:     Thyroid: No thyromegaly.  Cardiovascular:     Rate and Rhythm: Normal rate and regular rhythm.     Heart sounds: Normal heart sounds. No murmur heard. Pulmonary:     Effort: Pulmonary effort is normal.     Breath sounds: Normal breath sounds.  Abdominal:     Palpations: Abdomen is soft.     Tenderness: There is no abdominal tenderness. There is no guarding or rebound.  Musculoskeletal:        General: Normal range of motion.     Cervical back: Normal range of motion.  Lymphadenopathy:     Cervical: No cervical adenopathy.  Neurological:     General: No focal deficit present.     Mental Status: She is alert and oriented to person, place, and time.     Cranial Nerves: No cranial nerve deficit.  Skin:    General: Skin is warm and dry.  Psychiatric:        Mood and Affect: Mood normal.        Behavior: Behavior normal.        Thought Content: Thought content normal.        Judgment: Judgment normal.  Vitals reviewed.     Assessment/Plan: Encounter for annual routine gynecological examination  Encounter for screening mammogram for malignant neoplasm of breast - Plan: MM 3D SCREENING MAMMOGRAM BILATERAL BREAST; pt to schedule  mammo  Intractable menstrual migraine without status migrainosus - Plan: rizatriptan (MAXALT-MLT) 10 MG disintegrating tablet; Rx maxalt disintegrating tablet to use once migraines are so bad with vomiting. Otherwise, use OTC regimen if can catch migraines early enough. F/u with PCP prn.    No orders of the defined types were placed in this encounter.           GYN counsel adequate intake of calcium and vitamin D     F/U  No follow-ups on file.  Shanesha Bednarz B. Jairy Angulo, PA-C 10/05/2023 3:53 PM

## 2023-10-07 ENCOUNTER — Ambulatory Visit (INDEPENDENT_AMBULATORY_CARE_PROVIDER_SITE_OTHER): Payer: Commercial Managed Care - PPO | Admitting: Obstetrics and Gynecology

## 2023-10-07 ENCOUNTER — Other Ambulatory Visit: Payer: Self-pay

## 2023-10-07 ENCOUNTER — Encounter: Payer: Self-pay | Admitting: Obstetrics and Gynecology

## 2023-10-07 VITALS — BP 106/78 | Ht 67.0 in | Wt 242.0 lb

## 2023-10-07 DIAGNOSIS — Z01419 Encounter for gynecological examination (general) (routine) without abnormal findings: Secondary | ICD-10-CM

## 2023-10-07 DIAGNOSIS — G43839 Menstrual migraine, intractable, without status migrainosus: Secondary | ICD-10-CM

## 2023-10-07 DIAGNOSIS — Z1231 Encounter for screening mammogram for malignant neoplasm of breast: Secondary | ICD-10-CM

## 2023-10-07 MED ORDER — RIZATRIPTAN BENZOATE 10 MG PO TBDP
10.0000 mg | ORAL_TABLET | ORAL | 1 refills | Status: AC | PRN
  Filled 2023-10-07: qty 10, 30d supply, fill #0

## 2023-10-07 NOTE — Patient Instructions (Addendum)
 I value your feedback and you entrusting [place] with your care. If you get a [facility] patient survey, I would appreciate you taking the time to let us know about your experience today. Thank you!  [facility] ([place])--[phone]

## 2023-10-21 ENCOUNTER — Encounter: Payer: Self-pay | Admitting: Physician Assistant

## 2023-10-21 ENCOUNTER — Ambulatory Visit: Payer: Self-pay | Admitting: Physician Assistant

## 2023-10-21 VITALS — BP 127/77 | HR 78 | Resp 16 | Ht 67.0 in | Wt 238.4 lb

## 2023-10-21 DIAGNOSIS — Z0001 Encounter for general adult medical examination with abnormal findings: Secondary | ICD-10-CM

## 2023-10-21 DIAGNOSIS — E669 Obesity, unspecified: Secondary | ICD-10-CM | POA: Diagnosis not present

## 2023-10-21 DIAGNOSIS — Z0184 Encounter for antibody response examination: Secondary | ICD-10-CM

## 2023-10-21 DIAGNOSIS — G43109 Migraine with aura, not intractable, without status migrainosus: Secondary | ICD-10-CM

## 2023-10-21 DIAGNOSIS — J301 Allergic rhinitis due to pollen: Secondary | ICD-10-CM

## 2023-10-21 DIAGNOSIS — K638219 Small intestinal bacterial overgrowth, unspecified: Secondary | ICD-10-CM | POA: Diagnosis not present

## 2023-10-21 DIAGNOSIS — E559 Vitamin D deficiency, unspecified: Secondary | ICD-10-CM

## 2023-10-21 DIAGNOSIS — Z0000 Encounter for general adult medical examination without abnormal findings: Secondary | ICD-10-CM

## 2023-10-21 NOTE — Progress Notes (Signed)
 Complete physical exam  Patient: Jenna Wright   DOB: Jul 01, 1981   43 y.o. Female  MRN: [id number] Visit Date: 10/21/2023  Today's healthcare provider: Debera Lat, PA-C   Chief Complaint  Patient presents with   Annual Exam    CPE  no other concerns   Subjective    Jenna Wright is a 43 y.o. female who presents today for a complete physical exam.   Discussed the use of AI scribe software for clinical note transcription with the patient, who gave verbal consent to proceed.  History of Present Illness The patient, a dietitian and vet tech, presents for a routine physical examination. The patient has been dealing with increased stress due to family health issues, including the recent passing of her grandmother and her mother's cancer diagnosis. This stress has led to an increase in food intake, particularly sugar, resulting in weight gain. The patient acknowledges the need for dietary control but admits to struggling with maintaining a healthy diet consistently.  The patient also has a history of Small Intestinal Bacterial Overgrowth (SIBO), which she manages with a no-sugar diet and daily intake of kefir. The patient reports that stress and sugar intake exacerbate her SIBO symptoms, which include bloating and discomfort. The patient has found relief from these symptoms by adhering to a no-sugar diet and consuming kefir regularly.  The patient also reports having allergies, which are currently active. The patient is scheduled for an allergy appointment in the near future. The patient also mentions having regular menstrual periods with no associated issues.    Last depression screening scores    10/21/2023    9:12 AM 10/20/2022    8:50 AM 10/16/2021    2:40 PM  PHQ 2/9 Scores  PHQ - 2 Score 0 0 0  PHQ- 9 Score 0  0   Last fall risk screening    10/20/2022    8:50 AM  Fall Risk   Falls in the past year? 0  Number falls in past yr: 0  Injury with Fall? 0   Last  Audit-C alcohol use screening    10/20/2023   11:46 PM  Alcohol Use Disorder Test (AUDIT)  1. How often do you have a drink containing alcohol? 0  3. How often do you have six or more drinks on one occasion? 0   A score of 3 or more in women, and 4 or more in men indicates increased risk for alcohol abuse, EXCEPT if all of the points are from question 1   Past Medical History:  Diagnosis Date   Allergic rhinitis    Angio-edema    Menstrual migraine    Obesity    Psoriasis    Urticaria    Past Surgical History:  Procedure Laterality Date   CHOLECYSTECTOMY     COLONOSCOPY WITH PROPOFOL N/A 06/19/2021   Procedure: COLONOSCOPY WITH PROPOFOL;  Surgeon: Toney Reil, MD;  Location: [facility] ENDOSCOPY;  Service: Gastroenterology;  Laterality: N/A;   ESOPHAGOGASTRODUODENOSCOPY N/A 06/19/2021   Procedure: ESOPHAGOGASTRODUODENOSCOPY (EGD);  Surgeon: Toney Reil, MD;  Location: [facility] ENDOSCOPY;  Service: Gastroenterology;  Laterality: N/A;   Social History   Socioeconomic History   Marital status: Single    Spouse name: Not on file   Number of children: 0   Years of education: Not on file   Highest education level: Master's degree (e.g., MA, MS, MEng, MEd, MSW, MBA)  Occupational History   Occupation: dietician  Tobacco Use   Smoking  status: Former    Current packs/day: 0.00    Average packs/day: 0.5 packs/day for 2.0 years (1.0 ttl pk-yrs)    Types: Cigarettes    Start date: 2003    Quit date: 2005    Years since quitting: 20.2    Passive exposure: Never   Smokeless tobacco: Never  Vaping Use   Vaping status: Never Used  Substance and Sexual Activity   Alcohol use: No   Drug use: No   Sexual activity: Not Currently    Partners: Male    Birth control/protection: None  Other Topics Concern   Not on file  Social History Narrative   Not on file   Social Drivers of Health   Financial Resource Strain: Low Risk  (10/20/2023)   Overall Financial Resource Strain  (CARDIA)    Difficulty of Paying Living Expenses: Not hard at all  Food Insecurity: No Food Insecurity (10/20/2023)   Hunger Vital Sign    Worried About Running Out of Food in the Last Year: Never true    Ran Out of Food in the Last Year: Never true  Transportation Needs: No Transportation Needs (10/20/2023)   PRAPARE - Administrator, Civil Service (Medical): No    Lack of Transportation (Non-Medical): No  Physical Activity: Sufficiently Active (10/20/2023)   Exercise Vital Sign    Days of Exercise per Week: 5 days    Minutes of Exercise per Session: 40 min  Stress: No Stress Concern Present (10/20/2023)   [employer] of Occupational Health - Occupational Stress Questionnaire    Feeling of Stress : Not at all  Social Connections: Unknown (10/20/2023)   Social Connection and Isolation Panel [NHANES]    Frequency of Communication with Friends and Family: More than three times a week    Frequency of Social Gatherings with Friends and Family: More than three times a week    Attends Religious Services: More than 4 times per year    Active Member of [employer] or Organizations: Yes    Attends Engineer, structural: More than 4 times per year    Marital Status: Patient declined  Catering manager Violence: Not on file   Family Status  Relation Name Status   Mother  Alive   Father  Alive   Mat Aunt  Alive   Mat Uncle  Deceased   Pat Aunt  Alive   MGM  Alive   MGF  Deceased   PGM  Deceased   PGF  Deceased   Neg Hx  (Not Specified)  No partnership data on file   Family History  Problem Relation Age of Onset   Autoimmune disease Mother    Diabetes type II Father    Psoriasis Maternal Aunt        with psoriatic arthritis   Melanoma Maternal Uncle        with brain mets   Eczema Paternal Aunt    Basal cell carcinoma Maternal Grandmother    Colon cancer Maternal Grandmother 80   Congestive Heart Failure Maternal Grandmother    Lung cancer Maternal Grandmother 80    Atrial fibrillation Maternal Grandmother    Alzheimer's disease Maternal Grandfather    Diabetes Paternal Grandmother    Breast cancer Neg Hx    Allergies  Allergen Reactions   Other Shortness Of Breath and Swelling    Bee stings   Penicillins Hives and Rash    Patient Care Team: Cherlynn Polo as PCP - General (Physician Assistant)  Medications: Outpatient Medications Prior to Visit  Medication Sig   azelastine (ASTELIN) 0.1 % nasal spray Place 1 spray into both nostrils 2 (two) times daily as needed for rhinitis. Use in each nostril as directed   fluticasone (FLONASE) 50 MCG/ACT nasal spray Place 2 sprays into both nostrils daily.   ipratropium (ATROVENT) 0.06 % nasal spray Place 2 sprays into both nostrils 4 (four) times daily as needed for rhinitis.   rizatriptan (MAXALT-MLT) 10 MG disintegrating tablet Take 1 tablet (10 mg total) by mouth as needed for migraine. May repeat in 2 hours if needed   No facility-administered medications prior to visit.    Review of Systems  All other systems reviewed and are negative.  Except see HPI     Objective    BP 127/77 (BP Location: Left Arm, Patient Position: Sitting, Cuff Size: Normal)   Pulse 78   Resp 16   Ht 5\' 7"  (1.702 m)   Wt 238 lb 6.4 oz (108.1 kg)   LMP 10/07/2023 (Exact Date)   SpO2 100%   BMI 37.34 kg/m      Physical Exam Vitals reviewed.  Constitutional:      General: She is not in acute distress.    Appearance: Normal appearance. She is well-developed. She is obese. She is not ill-appearing, toxic-appearing or diaphoretic.  HENT:     Head: Normocephalic and atraumatic.     Right Ear: Tympanic membrane, ear canal and external ear normal.     Left Ear: Tympanic membrane, ear canal and external ear normal.     Nose: Nose normal. No congestion or rhinorrhea.     Mouth/Throat:     Mouth: Mucous membranes are moist.     Pharynx: Oropharynx is clear. No oropharyngeal exudate.  Eyes:     General: No  scleral icterus.       Right eye: No discharge.        Left eye: No discharge.     Conjunctiva/sclera: Conjunctivae normal.     Pupils: Pupils are equal, round, and reactive to light.  Neck:     Thyroid: No thyromegaly.     Vascular: No carotid bruit.  Cardiovascular:     Rate and Rhythm: Normal rate and regular rhythm.     Pulses: Normal pulses.     Heart sounds: Normal heart sounds. No murmur heard.    No friction rub. No gallop.  Pulmonary:     Effort: Pulmonary effort is normal. No respiratory distress.     Breath sounds: Normal breath sounds. No wheezing or rales.  Abdominal:     General: Abdomen is flat. Bowel sounds are normal. There is no distension.     Palpations: Abdomen is soft. There is no mass.     Tenderness: There is no abdominal tenderness. There is no right CVA tenderness, left CVA tenderness, guarding or rebound.     Hernia: No hernia is present.  Musculoskeletal:        General: No swelling, tenderness, deformity or signs of injury. Normal range of motion.     Cervical back: Normal range of motion and neck supple. No rigidity or tenderness.     Right lower leg: No edema.     Left lower leg: No edema.  Lymphadenopathy:     Cervical: No cervical adenopathy.  Skin:    General: Skin is warm and dry.     Coloration: Skin is not jaundiced or pale.     Findings: No bruising, erythema, lesion or rash.  Neurological:     Mental Status: She is alert and oriented to person, place, and time. Mental status is at baseline.     Gait: Gait normal.  Psychiatric:        Mood and Affect: Mood normal.        Behavior: Behavior normal.        Thought Content: Thought content normal.        Judgment: Judgment normal.     No results found for any visits on 10/21/23.  Assessment & Plan    Routine Health Maintenance and Physical Exam  Exercise Activities and Dietary recommendations  Goals    Weight loss of 5% of pt's current weight via healthy diet and daily exercise  encouraged.      Immunization History  Administered Date(s) Administered   Influenza-Unspecified 03/20/2018, 04/24/2019   Tdap 08/15/2012    Health Maintenance  Topic Date Due   DTaP/Tdap/Td vaccine (2 - Td or Tdap) 08/15/2022   COVID-19 Vaccine (1 - 2024-25 season) Never done   Flu Shot  02/18/2024   Pap with HPV screening  10/03/2026   Hepatitis C Screening  Completed   HIV Screening  Completed   HPV Vaccine  Aged Out    Discussed health benefits of physical activity, and encouraged her to engage in regular exercise appropriate for her age and condition.  Assessment & Plan Annual physical exam (Primary) Needs to stay uptodate with dental/eye exams Things to do to keep yourself healthy  - Exercise at least 30-45 minutes a day, 3-4 days a week.  - Eat a low-fat diet with lots of fruits and vegetables, up to 7-9 servings per day.  - Seatbelts can save your life. Wear them always.  - Smoke detectors on every level of your home, check batteries every year.  - Eye Doctor - have an eye exam every 1-2 years  - Safe sex - if you may be exposed to STDs, use a condom.  - Alcohol -  If you drink, do it moderately, less than 2 drinks per day.  - Health Care Power of Attorney. Choose someone to speak for you if you are not able.  - Depression is common in our stressful world.If you're feeling down or losing interest in things you normally enjoy, please come in for a visit.  - Violence - If anyone is threatening or hurting you, please call immediately.   Seasonal allergic rhinitis due to pollen  Vitamin D deficiency - VITAMIN D 25 Hydroxy (Vit-D Deficiency, Fractures)  Obesity (BMI 30-39.9) Chronic and Body mass index is 37.34 kg/m. Weight loss of 5% of pt's current weight via healthy diet and daily exercise encouraged. Ordered - Lipid panel - Comprehensive metabolic panel with GFR Will reassess after  receiving lab results Will FU  Immunities to rabies - Rabies Neut.Abs  Titrat.(RFFIT)  Small Intestinal Bacterial Overgrowth (SIBO) Chronic SIBO exacerbated by sugar intake and stress. No-sugar diet and kefir recommended. Keto diet effective short-term but risky long-term. Sugar identified as primary trigger. FODMAP foods advised against by GI specialist. - Continue no-sugar diet. - Consume kefir daily. - Avoid long-term keto diet. Will follow-up  Allergic Rhinitis Seasonal allergies with emerging symptoms. Consultation with allergy specialist scheduled. - Attend allergy specialist appointment on April 29.  Vitamin D Deficiency chronic Low vitamin D managed with 2000 IU daily. Monitoring necessary to prevent toxicity. - Check vitamin D levels during blood work.  Rabies Exposure Risk Regular rabies titer checks required due to occupational exposure. -  Check rabies titer during blood work.  Return in about 1 year (around 10/20/2024) for CPE.    The patient was advised to call back or seek an in-person evaluation if the symptoms worsen or if the condition fails to improve as anticipated.  I discussed the assessment and treatment plan with the patient. The patient was provided an opportunity to ask questions and all were answered. The patient agreed with the plan and demonstrated an understanding of the instructions.  I, Debera Lat, PA-C have reviewed all documentation for this visit. The documentation on 10/21/2023  for the exam, diagnosis, procedures, and orders are all accurate and complete.  Debera Lat, [facility], MMS [facility] [phone] (phone) [phone] (fax)  [facility]

## 2023-10-22 DIAGNOSIS — Z0184 Encounter for antibody response examination: Secondary | ICD-10-CM | POA: Insufficient documentation

## 2023-10-22 DIAGNOSIS — G43109 Migraine with aura, not intractable, without status migrainosus: Secondary | ICD-10-CM | POA: Insufficient documentation

## 2023-10-22 DIAGNOSIS — E669 Obesity, unspecified: Secondary | ICD-10-CM | POA: Insufficient documentation

## 2023-10-22 DIAGNOSIS — E559 Vitamin D deficiency, unspecified: Secondary | ICD-10-CM | POA: Insufficient documentation

## 2023-10-22 LAB — LIPID PANEL
Chol/HDL Ratio: 2.9 ratio (ref 0.0–4.4)
Cholesterol, Total: 215 mg/dL — ABNORMAL HIGH (ref 100–199)
HDL: 75 mg/dL (ref >39)
LDL Chol Calc (NIH): 125 mg/dL — ABNORMAL HIGH (ref 0–99)
Triglycerides: 83 mg/dL (ref 0–149)
VLDL Cholesterol Cal: 15 mg/dL (ref 5–40)

## 2023-10-22 LAB — COMPREHENSIVE METABOLIC PANEL WITH GFR
ALT: 15 IU/L (ref 0–32)
AST: 16 IU/L (ref 0–40)
Albumin: 4.3 g/dL (ref 3.9–4.9)
Alkaline Phosphatase: 100 IU/L (ref 44–121)
BUN/Creatinine Ratio: 17 (ref 9–23)
BUN: 12 mg/dL (ref 6–24)
Bilirubin Total: 0.4 mg/dL (ref 0.0–1.2)
CO2: 22 mmol/L (ref 20–29)
Calcium: 9.2 mg/dL (ref 8.7–10.2)
Chloride: 104 mmol/L (ref 96–106)
Creatinine, Ser: 0.7 mg/dL (ref 0.57–1.00)
Globulin, Total: 2.5 g/dL (ref 1.5–4.5)
Glucose: 94 mg/dL (ref 70–99)
Potassium: 4.1 mmol/L (ref 3.5–5.2)
Sodium: 140 mmol/L (ref 134–144)
Total Protein: 6.8 g/dL (ref 6.0–8.5)
eGFR: 110 mL/min/{1.73_m2} (ref >59)

## 2023-10-22 LAB — VITAMIN D 25 HYDROXY (VIT D DEFICIENCY, FRACTURES): Vit D, 25-Hydroxy: 23.9 ng/mL — ABNORMAL LOW (ref 30.0–100.0)

## 2023-10-23 DIAGNOSIS — K638219 Small intestinal bacterial overgrowth, unspecified: Secondary | ICD-10-CM | POA: Insufficient documentation

## 2023-10-25 ENCOUNTER — Encounter: Payer: Self-pay | Admitting: Physician Assistant

## 2023-11-05 LAB — RABIES NEUT.ABS TITRAT.(RFFIT)

## 2023-11-11 ENCOUNTER — Other Ambulatory Visit: Payer: Self-pay

## 2023-11-16 ENCOUNTER — Other Ambulatory Visit: Payer: Self-pay

## 2023-11-16 ENCOUNTER — Encounter: Payer: Self-pay | Admitting: Internal Medicine

## 2023-11-16 ENCOUNTER — Ambulatory Visit: Payer: 59 | Admitting: Internal Medicine

## 2023-11-16 VITALS — BP 128/74 | HR 82 | Temp 98.2°F | Resp 16 | Ht 67.0 in | Wt 238.0 lb

## 2023-11-16 DIAGNOSIS — J3089 Other allergic rhinitis: Secondary | ICD-10-CM

## 2023-11-16 DIAGNOSIS — J302 Other seasonal allergic rhinitis: Secondary | ICD-10-CM

## 2023-11-16 MED ORDER — IPRATROPIUM BROMIDE 0.06 % NA SOLN
2.0000 | Freq: Four times a day (QID) | NASAL | 5 refills | Status: DC | PRN
  Filled 2023-11-16: qty 15, 19d supply, fill #0

## 2023-11-16 MED ORDER — LEVOCETIRIZINE DIHYDROCHLORIDE 5 MG PO TABS
5.0000 mg | ORAL_TABLET | Freq: Every evening | ORAL | 5 refills | Status: DC
  Filled 2023-11-16: qty 30, 30d supply, fill #0

## 2023-11-16 MED ORDER — LEVOCETIRIZINE DIHYDROCHLORIDE 5 MG PO TABS
5.0000 mg | ORAL_TABLET | Freq: Every evening | ORAL | 11 refills | Status: AC
  Filled 2023-11-16: qty 30, 30d supply, fill #0
  Filled 2024-02-23: qty 30, 30d supply, fill #1

## 2023-11-16 MED ORDER — IPRATROPIUM BROMIDE 0.06 % NA SOLN
2.0000 | Freq: Four times a day (QID) | NASAL | 11 refills | Status: AC | PRN
  Filled 2023-11-16: qty 15, 19d supply, fill #0
  Filled 2024-02-23: qty 15, 19d supply, fill #1

## 2023-11-16 MED ORDER — RYALTRIS 665-25 MCG/ACT NA SUSP: 2.0000 | Freq: Two times a day (BID) | NASAL | 5 refills | Status: DC

## 2023-11-16 MED ORDER — RYALTRIS 665-25 MCG/ACT NA SUSP
2.0000 | Freq: Two times a day (BID) | NASAL | 11 refills | Status: AC
Start: 2023-11-16 — End: ?

## 2023-11-16 NOTE — Patient Instructions (Addendum)
 Allergic Rhinitis: - Positive skin test 08/2022: [place] grass, dust mites, mold - Use nasal saline rinses before nose sprays such as with Neilmed Sinus Rinse bottle.  Use distilled water. - Start Ryaltris 2 sprays each nostril twice daily.  Aim upward and outward.  If expensive, restart Flonase  2 sprays each nostril daily and Azelastine  2 sprays each nostril twice daily.    - Use Ipratropium 1-2 sprays each nostril up to three times a day as needed. Aim upward and outward.   - Use Xyzal 5mg  daily as needed for runny nose, sneezing, itchy watery eyes.  - Consider allergy  shots as long term control of your symptoms by teaching your immune system to be more tolerant of your allergy  triggers.  Penicillin Allergy  - Your history of reaction is low so recommend a direct amoxicillin challenge when able.   Would need to hold anti histamines 3 days prior.

## 2023-11-16 NOTE — Addendum Note (Signed)
 Addended by: Bari Boos B on: 11/16/2023 04:08 PM   Modules accepted: Orders

## 2023-11-16 NOTE — Progress Notes (Signed)
 Medication Samples have been provided to the patient.  Drug name: Ryaltris      Strength: 665/25 mcg        Qty: 1  LOT: [id number]  Exp.Date: 10/17/24  Dosing instructions: Start Ryaltris 2 sprays each nostril twice daily.  Aim upward and outward.  If expensive, restart Flonase  2 sprays each nostril daily and Azelastine  2 sprays each nostril twice daily.     The patient has been instructed regarding the correct time, dose, and frequency of taking this medication, including desired effects and most common side effects.   Jenna Wright 4:06 PM 11/16/2023

## 2023-11-16 NOTE — Progress Notes (Signed)
   FOLLOW UP Date of Service/Encounter:  11/16/23   Subjective:  Jenna Wright (DOB: 07-Mar-1981) is a 43 y.o. female who [facility] for follow up for allergic rhinitis.   History obtained from: chart review and patient. Last visit was with me on 05/18/2023 and at the time was doing better on Flonase , PRN Azelastine /Ipratropium and Zyrtec . Have discussed direct amoxicillin challenge in the past.  Reports when the pollen season first started, had a lot of congestion, drainage, sneezing.  Now improved.  Still has some post nasal drip but doing a lot better.  On Flonase  daily, Azelastine  twice daily and rarely Ipratropium.  Tried Zyrtec  daily without relief, has stopped it.    Past Medical History: Past Medical History:  Diagnosis Date   Allergic rhinitis    Angio-edema    Menstrual migraine    Obesity    Psoriasis    Urticaria     Objective:  BP 128/74 (BP Location: Right Arm, Patient Position: Sitting, Cuff Size: Normal)   Pulse 82   Temp 98.2 F (36.8 C) (Temporal)   Resp 16   Ht 5\' 7"  (1.702 m)   Wt 238 lb (108 kg)   SpO2 97%   BMI 37.28 kg/m  Body mass index is 37.28 kg/m. Physical Exam: GEN: alert, well developed HEENT: clear conjunctiva, nose with mild inferior turbinate hypertrophy, pink nasal mucosa, no rhinorrhea, + cobblestoning HEART: regular rate and rhythm, no murmur LUNGS: clear to auscultation bilaterally, no coughing, unlabored respiration SKIN: no rashes or lesions  Assessment:   1. Seasonal and perennial allergic rhinitis     Plan/Recommendations:   Allergic Rhinitis: - Improved, will try Ryaltris for better response.  - Positive skin test 08/2022: [place] grass, dust mites, mold - Use nasal saline rinses before nose sprays such as with Neilmed Sinus Rinse bottle.  Use distilled water. - Start Ryaltris 2 sprays each nostril twice daily.  Aim upward and outward.  If expensive, restart Flonase  2 sprays  each nostril daily and Azelastine  2 sprays each nostril twice daily.    - Use Ipratropium 1-2 sprays each nostril up to three times a day as needed. Aim upward and outward.   - Use Xyzal 5mg  daily as needed for runny nose, sneezing, itchy watery eyes. This replaces Zyrtec .  - Consider allergy  shots as long term control of your symptoms by teaching your immune system to be more tolerant of your allergy  triggers.  Penicillin Allergy  - Your history of reaction is low so recommend a direct amoxicillin challenge when able.   Would need to hold anti histamines 3 days prior.      Return in about 1 year (around 11/15/2024).  Kristen Petri, MD [facility]

## 2023-11-17 ENCOUNTER — Other Ambulatory Visit: Payer: Self-pay

## 2023-11-25 ENCOUNTER — Encounter (HOSPITAL_COMMUNITY): Payer: Self-pay

## 2023-12-02 ENCOUNTER — Ambulatory Visit
Admission: RE | Admit: 2023-12-02 | Discharge: 2023-12-02 | Disposition: A | Discharge status: Home / Self Care | Source: Ambulatory Visit | Attending: Obstetrics and Gynecology | Admitting: Obstetrics and Gynecology

## 2023-12-02 DIAGNOSIS — Z1231 Encounter for screening mammogram for malignant neoplasm of breast: Secondary | ICD-10-CM

## 2023-12-06 ENCOUNTER — Ambulatory Visit: Payer: Self-pay | Admitting: Obstetrics and Gynecology

## 2023-12-23 IMAGING — MG MM DIGITAL SCREENING BILAT W/ TOMO AND CAD
6 of 10 series · 6 of 30 positions shown · non-contrast
Comparison: Previous exam(s).

ACR Breast Density Category a: The breast tissue is almost entirely
fatty.

CLINICAL DATA: Screening.

EXAM:
DIGITAL SCREENING BILATERAL MAMMOGRAM WITH TOMOSYNTHESIS AND CAD
TECHNIQUE: Bilateral screening digital craniocaudal and mediolateral oblique
mammograms were obtained. Bilateral screening digital breast
tomosynthesis was performed. The images were evaluated with
computer-aided detection.

[L CC synth-2D]
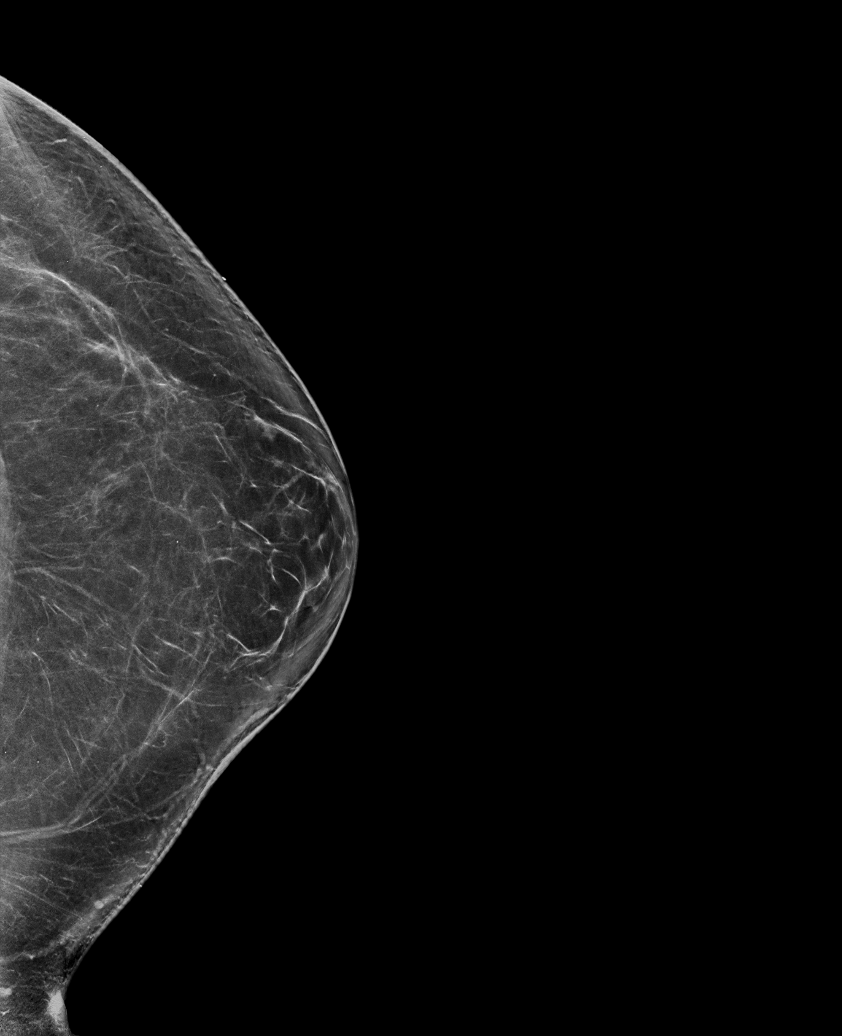

[R CC synth-2D]
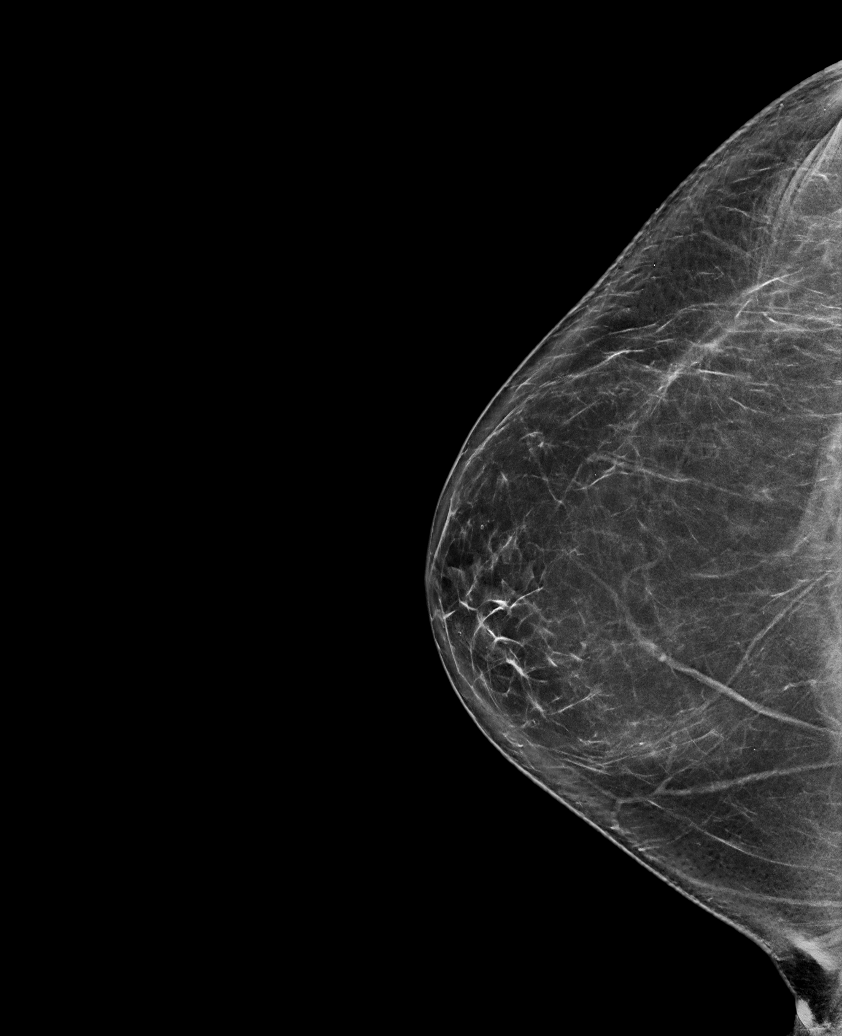

[L MLO synth-2D (1 of 2)]
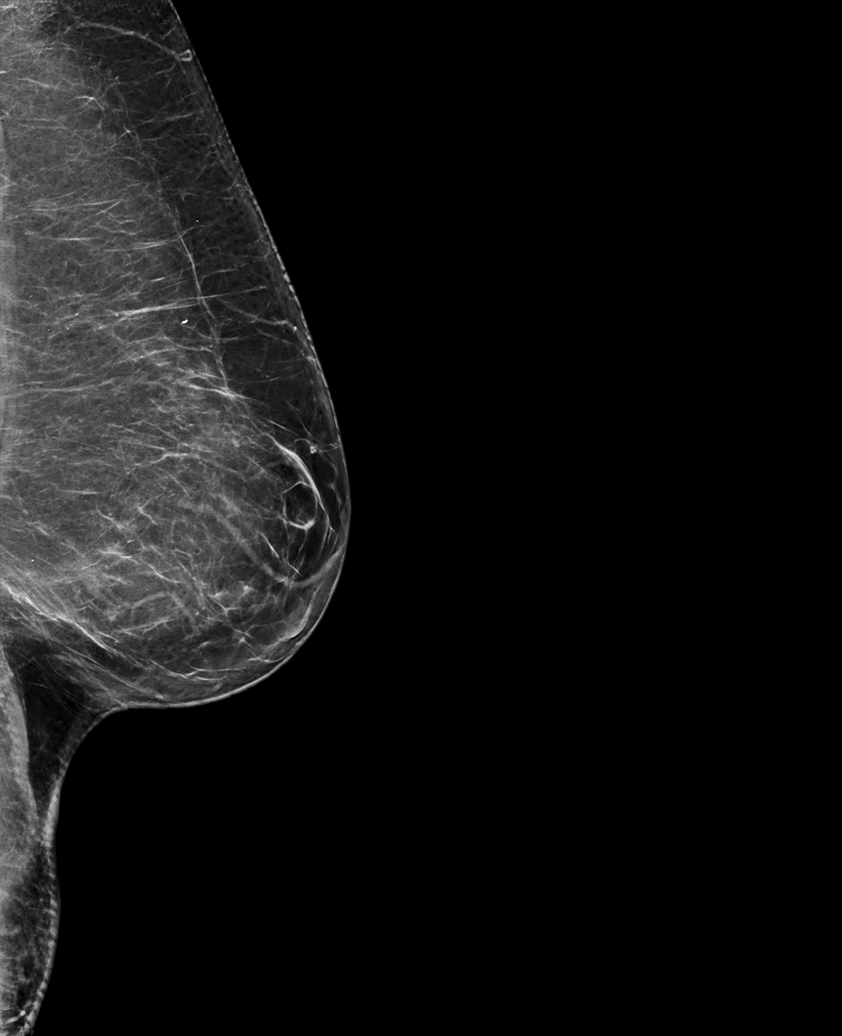

[R MLO synth-2D]
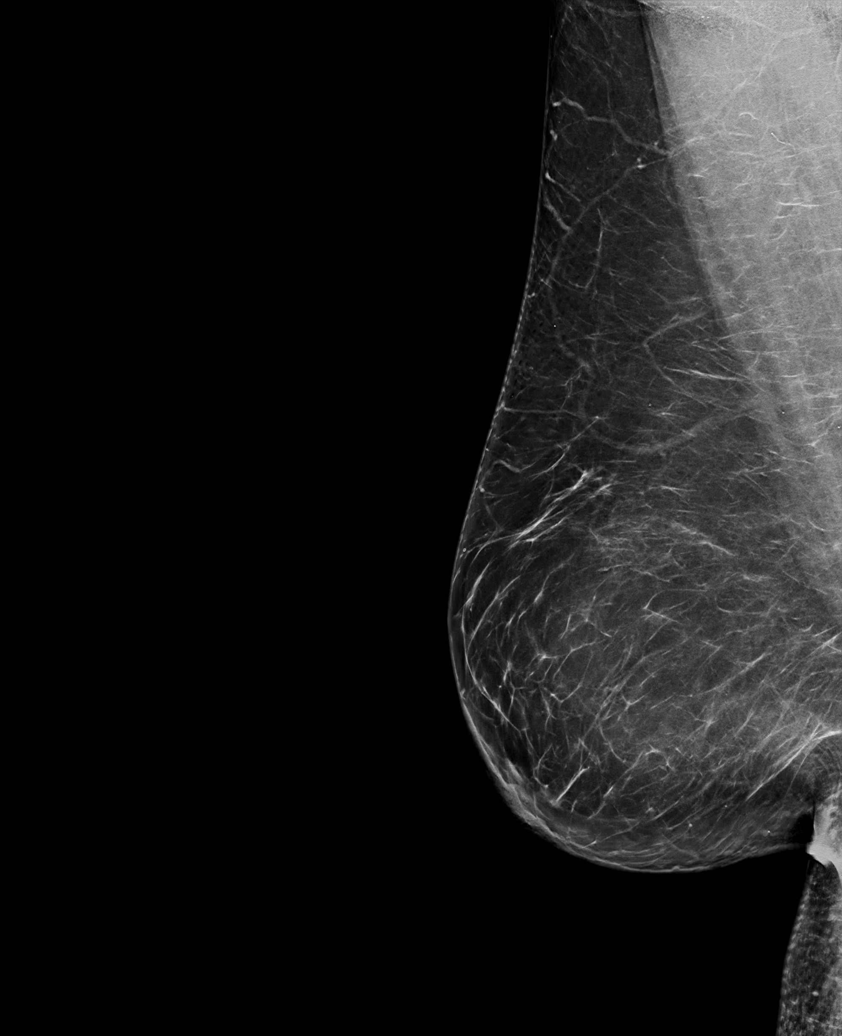

[L MLO synth-2D (2 of 2)]
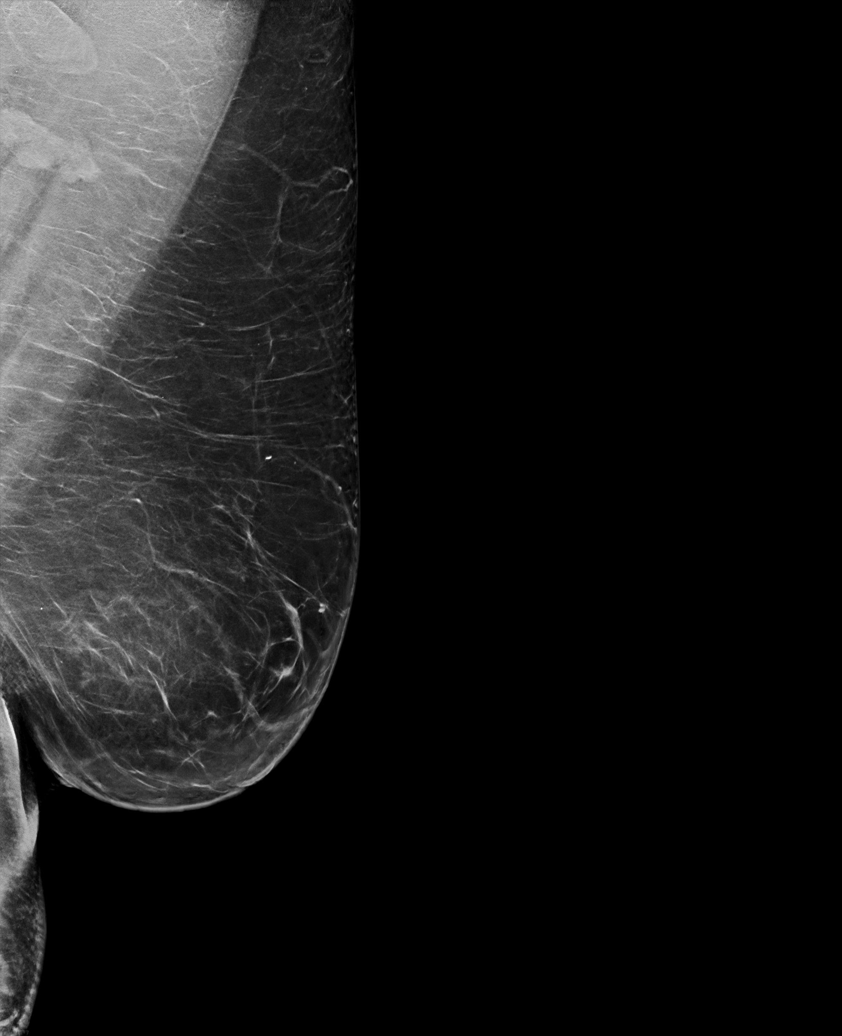

[R CC tomo · tomo slice 43/84.0]
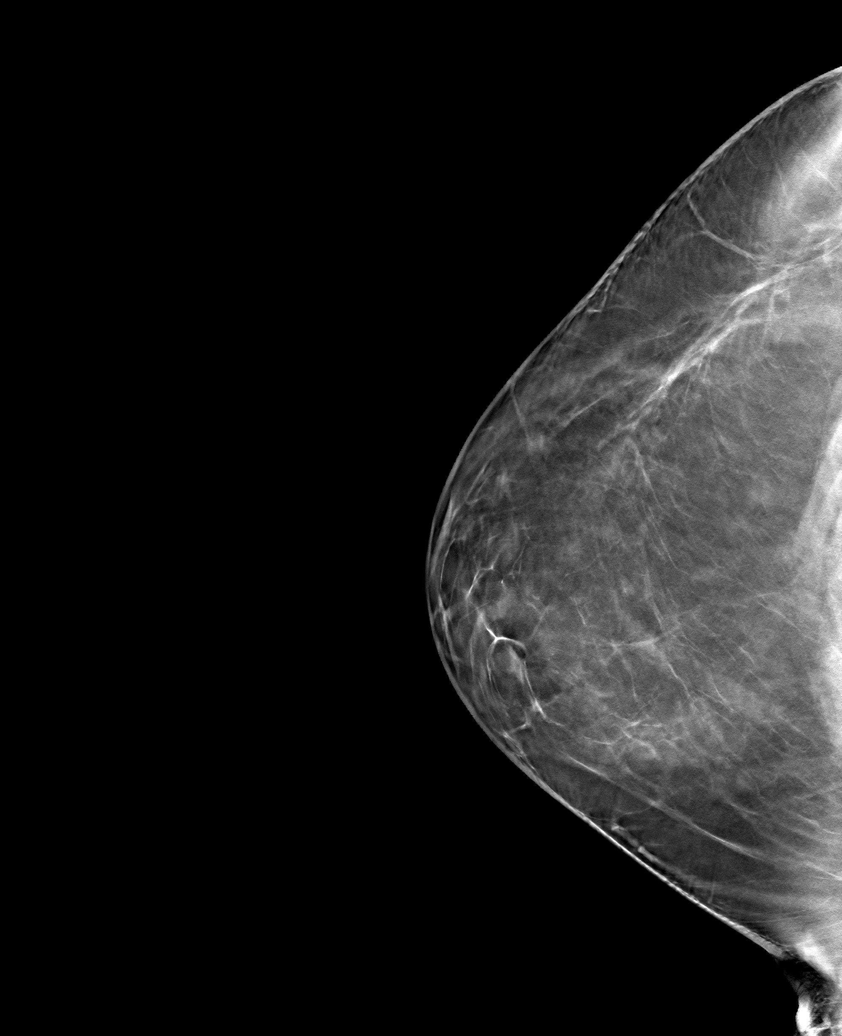

[6 of 30 positions shown; findings below may reference images not displayed]

FINDINGS: There are no findings suspicious for malignancy.
IMPRESSION: No mammographic evidence of malignancy. A result letter of this
screening mammogram will be mailed directly to the patient.

RECOMMENDATION:
Screening mammogram in one year. (Code:0E-3-N98)

BI-RADS CATEGORY  1: Negative.

## 2024-01-10 IMAGING — CT CT HEAD W/O CM
4 series · 17 of 47 positions shown, 19 images · non-contrast
Comparison: None Available.

CLINICAL DATA: Worsening headache.



[Series 2: head wo · axial · 0.44mm/px · z∈[-166,-36]mm · 7 of 36 slices shown, 9 images]
[im 5/36  brain]
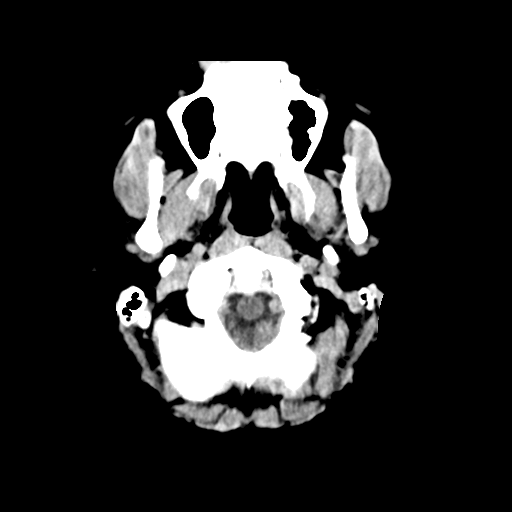
[im 5/36  bone]
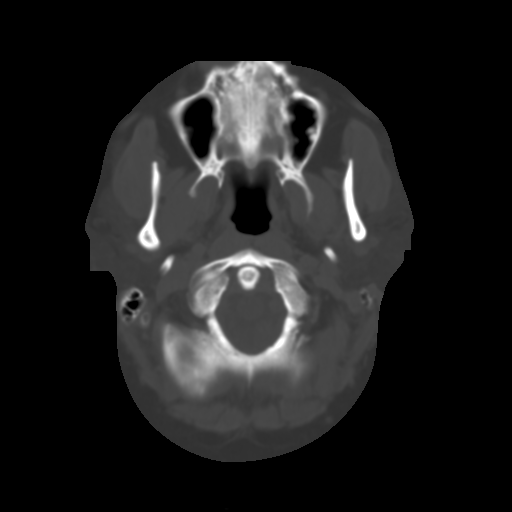
[im 9/36  brain]
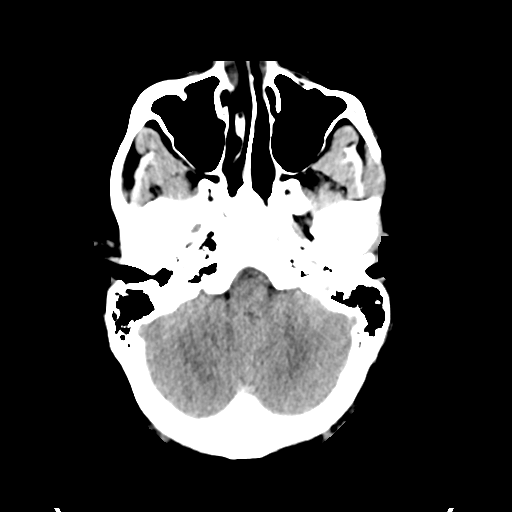
[im 14/36  brain]
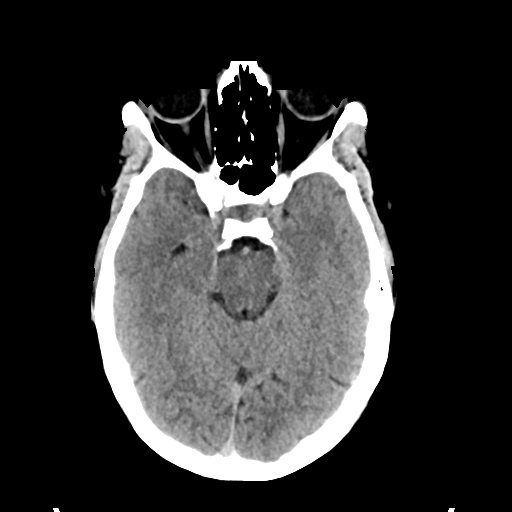
[im 18/36  brain]
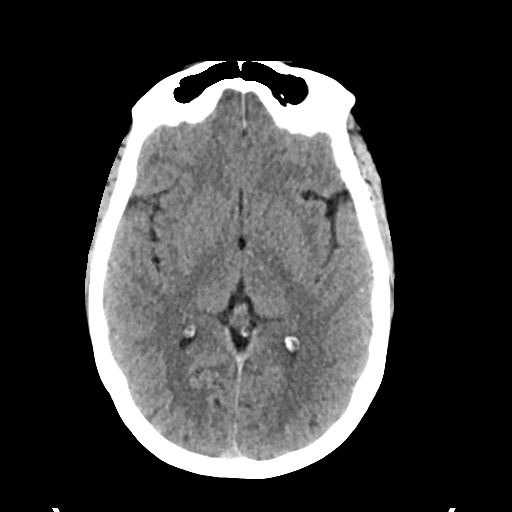
[im 22/36  brain]
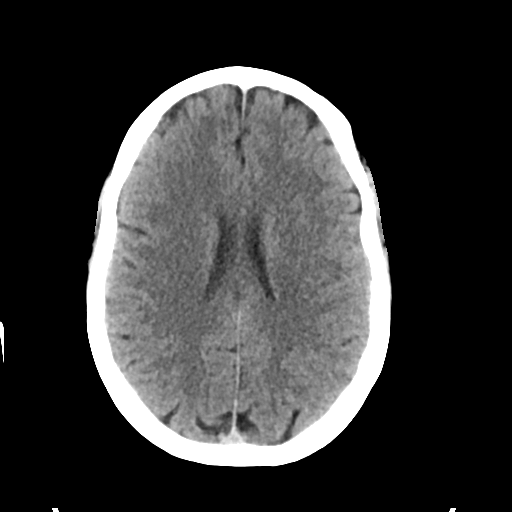
[im 22/36  bone]
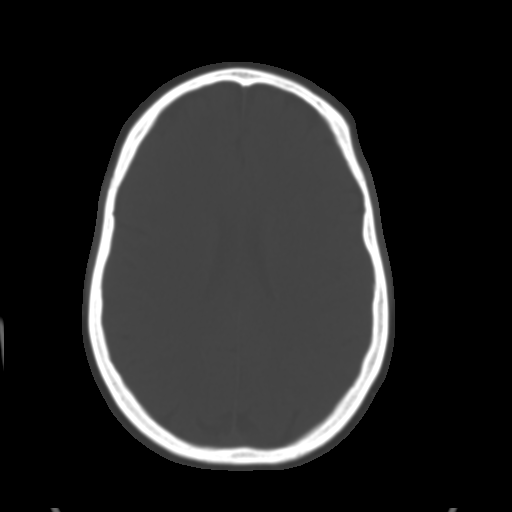
[im 27/36  brain]
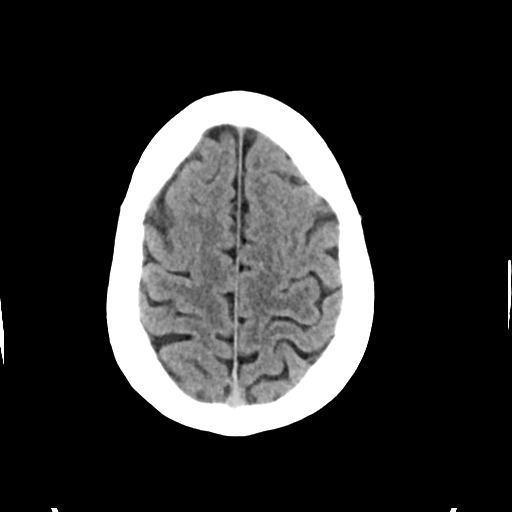
[im 31/36  brain]
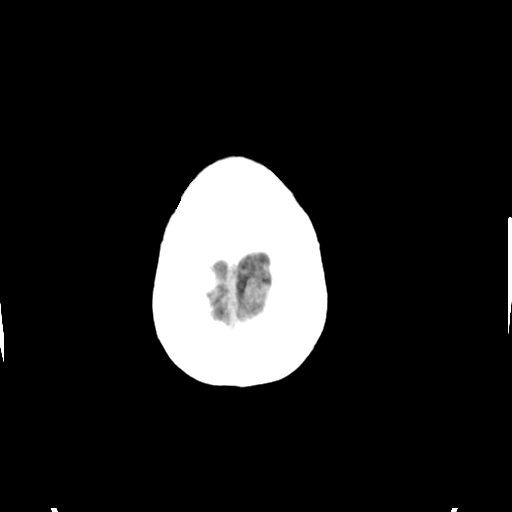

[Series 3: head bone · axial · 0.44mm/px · z∈[-170,-106]mm · 4 of 90 slices shown]
[im 9/90  bone]
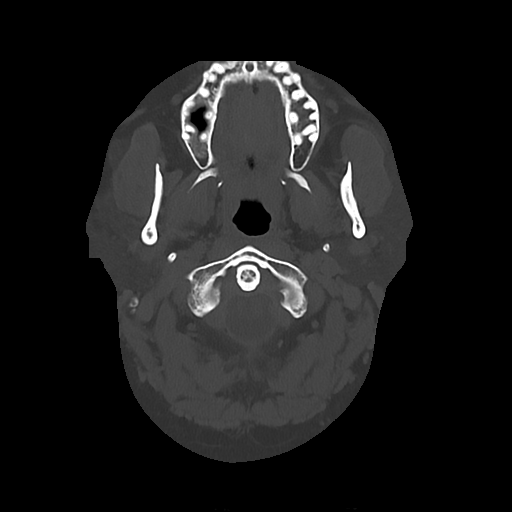
[im 18/90  bone]
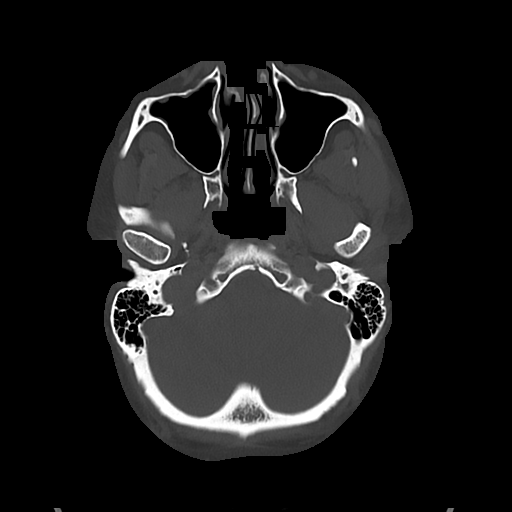
[im 27/90  bone]
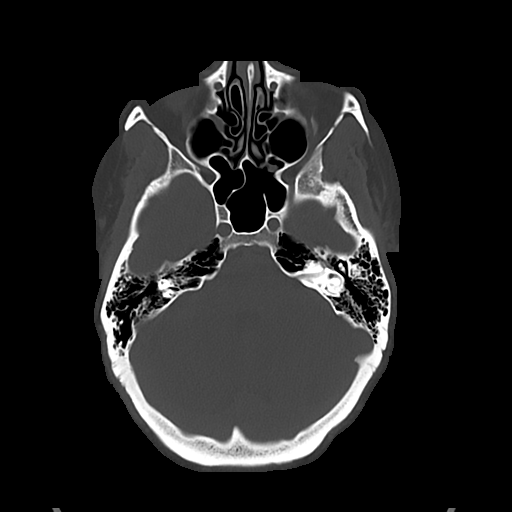
[im 41/90  bone]
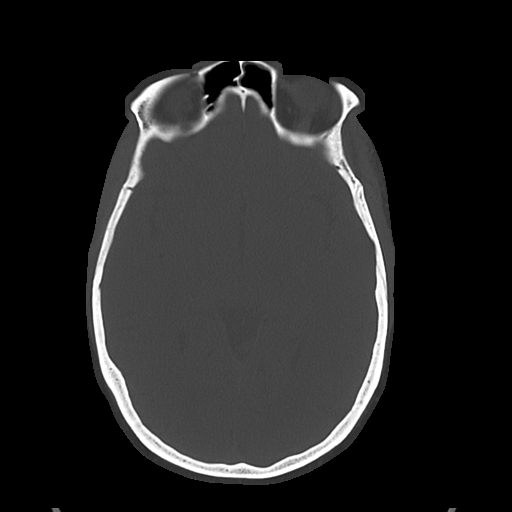

[Series 4: cor soft · coronal · 0.33mm/px · 3 of 72 slices shown]
[im 24/72  brain]
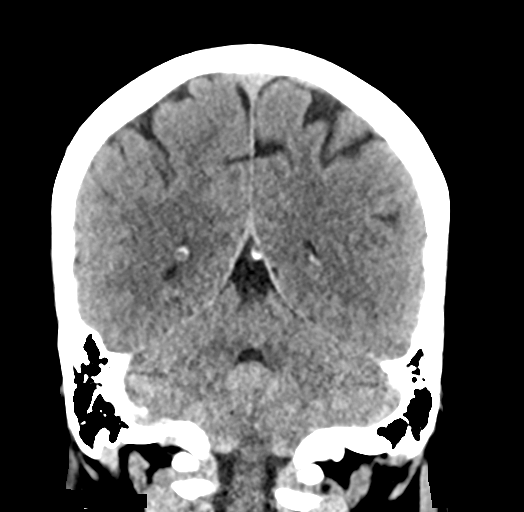
[im 32/72  brain]
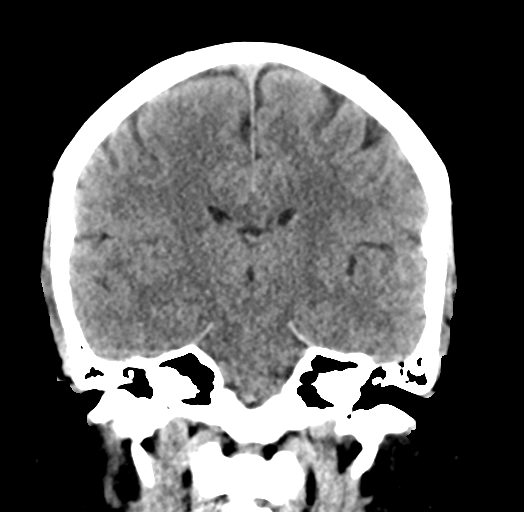
[im 40/72  brain]
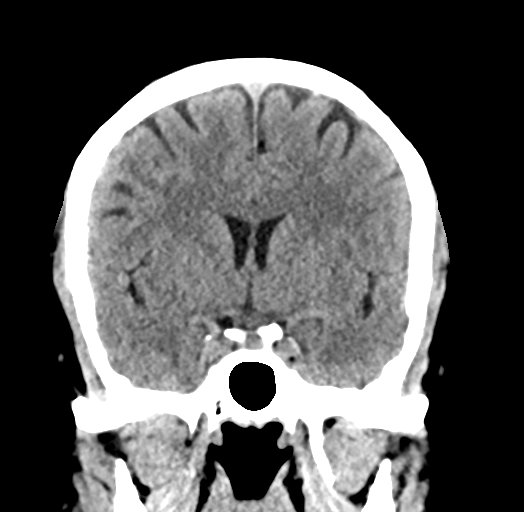

[Series 5: sag soft · sagittal · 0.36mm/px · 3 of 58 slices shown]
[im 20/58  brain]
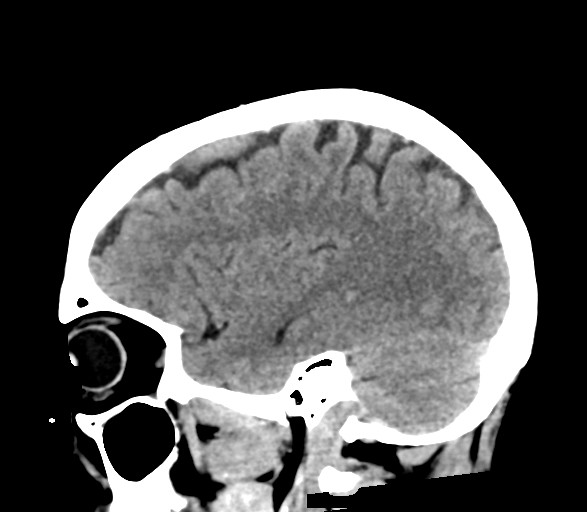
[im 29/58  brain]
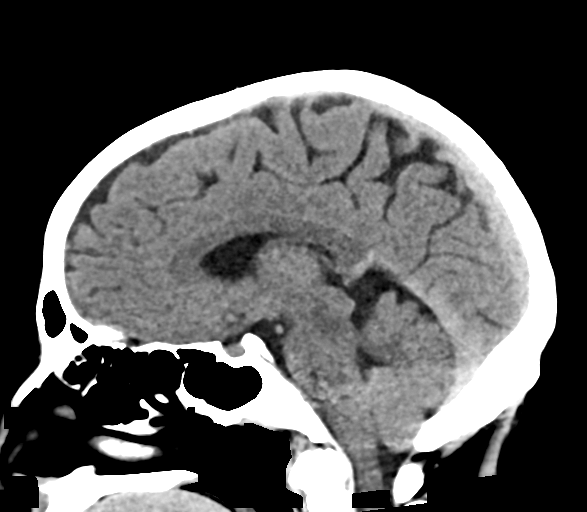
[im 39/58  brain]
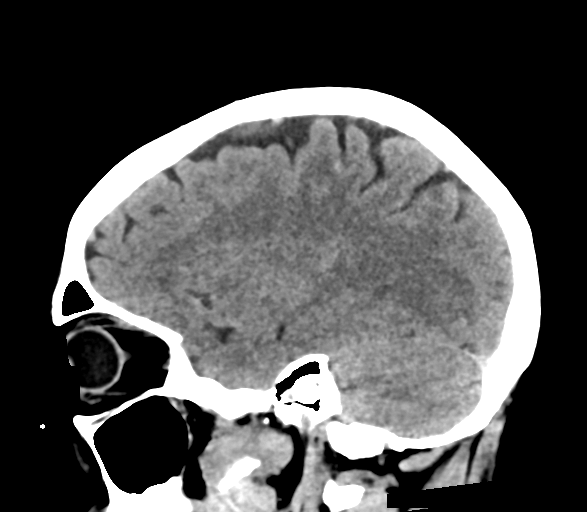

[17 of 47 positions shown; findings below may reference images not displayed]

FINDINGS: Brain: There is no evidence for acute hemorrhage, hydrocephalus,
mass lesion, or abnormal extra-axial fluid collection. No definite
CT evidence for acute infarction.

Vascular: No hyperdense vessel or unexpected calcification.

Skull: No evidence for fracture. No worrisome lytic or sclerotic
lesion.

Sinuses/Orbits: The visualized paranasal sinuses and mastoid air
cells are clear. Visualized portions of the globes and intraorbital
fat are unremarkable.

Other: None.
IMPRESSION: No acute intracranial abnormality.

## 2024-07-27 ENCOUNTER — Other Ambulatory Visit: Payer: Self-pay | Admitting: Internal Medicine

## 2024-07-28 ENCOUNTER — Other Ambulatory Visit: Payer: Self-pay

## 2024-07-28 ENCOUNTER — Other Ambulatory Visit (HOSPITAL_COMMUNITY): Payer: Self-pay

## 2024-07-28 MED ORDER — FLUTICASONE PROPIONATE 50 MCG/ACT NA SUSP
2.0000 | Freq: Every day | NASAL | 3 refills | Status: AC
  Filled 2024-07-28 (×2): qty 16, 30d supply, fill #0

## 2024-07-28 MED ORDER — AZELASTINE HCL 0.1 % NA SOLN
1.0000 | Freq: Two times a day (BID) | NASAL | 3 refills | Status: AC | PRN
  Filled 2024-07-28 (×2): qty 30, 50d supply, fill #0

## 2024-10-24 ENCOUNTER — Encounter: Admitting: Physician Assistant

## 2024-11-07 ENCOUNTER — Ambulatory Visit: Admitting: Internal Medicine
